# Patient Record
Sex: Female | Born: 1950 | Race: White | Hispanic: No | Marital: Married
Health system: Northeastern US, Academic
[De-identification: ages and names within clinical notes are randomized; demographics above are authoritative.]

## PROBLEM LIST (undated history)

## (undated) DIAGNOSIS — D499 Neoplasm of unspecified behavior of unspecified site: Secondary | ICD-10-CM

## (undated) HISTORY — PX: CHOLECYSTECTOMY: SHX55

## (undated) HISTORY — PX: ABDOMINAL HYSTERECTOMY: SHX81

## (undated) HISTORY — PX: ABDOMINAL SURGERY: SHX537

## (undated) HISTORY — PX: APPENDECTOMY: SHX54

---

## 2019-07-11 MED ORDER — ALBUTEROL SULFATE HFA 90 MCG/ACTUATION AEROSOL INHALER
90 | RESPIRATORY_TRACT | 12 refills | 25.00000 days | Status: AC | PRN
Start: 2019-07-11 — End: ?

## 2019-09-09 MED ORDER — LISINOPRIL 5 MG TABLET
5 | ORAL_TABLET | ORAL | 4 refills | 90.00000 days | Status: AC
Start: 2019-09-09 — End: 2019-10-14

## 2019-10-14 MED ORDER — LISINOPRIL 10 MG TABLET
10 | ORAL_TABLET | Freq: Every day | ORAL | 2 refills | 90.00000 days | Status: AC
Start: 2019-10-14 — End: 2020-04-13

## 2019-10-14 MED ORDER — QVAR 80 MCG/ACTUATION METERED AEROSOL ORAL INHALER
80 | Freq: Two times a day (BID) | RESPIRATORY_TRACT | 3 refills | 30.00000 days | Status: AC
Start: 2019-10-14 — End: 2020-10-21

## 2019-10-14 MED ORDER — VITAMIN D ORAL
ORAL | 0.00 refills | 90.00000 days | Status: AC
Start: 2019-10-14 — End: ?

## 2019-10-14 MED ORDER — TURMERIC ORAL
ORAL | 0.00 refills | 1.00000 days | Status: AC
Start: 2019-10-14 — End: ?

## 2019-10-21 ENCOUNTER — Inpatient Hospital Stay: Admit: 2019-10-21 | Discharge: 2019-10-21 | Payer: PRIVATE HEALTH INSURANCE

## 2019-10-21 ENCOUNTER — Encounter: Admit: 2019-10-21 | Payer: PRIVATE HEALTH INSURANCE | Attending: Medical

## 2019-10-21 ENCOUNTER — Ambulatory Visit: Admit: 2019-10-21 | Payer: PRIVATE HEALTH INSURANCE | Attending: Medical

## 2019-10-21 DIAGNOSIS — K559 Vascular disorder of intestine, unspecified: Secondary | ICD-10-CM

## 2019-10-21 DIAGNOSIS — I10 Essential (primary) hypertension: Secondary | ICD-10-CM

## 2019-10-21 DIAGNOSIS — M25551 Pain in right hip: Secondary | ICD-10-CM

## 2019-10-21 DIAGNOSIS — G8929 Other chronic pain: Secondary | ICD-10-CM

## 2019-10-21 DIAGNOSIS — K8020 Calculus of gallbladder without cholecystitis without obstruction: Secondary | ICD-10-CM

## 2019-10-21 DIAGNOSIS — M1990 Unspecified osteoarthritis, unspecified site: Secondary | ICD-10-CM

## 2019-10-21 DIAGNOSIS — E669 Obesity, unspecified: Secondary | ICD-10-CM

## 2019-10-21 DIAGNOSIS — E785 Hyperlipidemia, unspecified: Secondary | ICD-10-CM

## 2019-10-21 DIAGNOSIS — M5416 Radiculopathy, lumbar region: Secondary | ICD-10-CM

## 2019-10-21 DIAGNOSIS — R739 Hyperglycemia, unspecified: Secondary | ICD-10-CM

## 2019-10-21 NOTE — Progress Notes
Outpatient Orthopedic Clinic History and PhysicalChristine Bryant is a 69 y.o. year old female who presents today with bilateral hip pain. Past Medical History has a past medical history of Arthritis, Cholelithiasis, Chronic right hip pain, Essential hypertension, Hyperglycemia, Hyperlipidemia, Ischemic colitis (HC Code), and Obesity. She also has no past medical history of Hypercholesteremia, Malignant hyperthermia, Obstructive sleep apnea (adult) (pediatric), PONV (postoperative nausea and vomiting), or Sleep apnea.Past Surgical History has a past surgical history that includes Knee cartilage surgery; Appendectomy; Exploratory laparotomy (2014); Cholecystectomy; menisectomy (05/13/2010); laparotomy (Right, 02/2012); Hysterectomy; and Bilateral oophorectomy (Bilateral).AllergiesAllergies Allergen Reactions ? Nickel Rash MedicationsScheduled Meds:No current facility-administered medications for this visit.  Continuous Infusions:PRN Meds:.Social HistorySocial History Socioeconomic History ? Marital status: Married   Spouse name: Not on file ? Number of children: Not on file ? Years of education: Not on file ? Highest education level: Not on file Occupational History ? Not on file Social Needs ? Financial resource strain: Not on file ? Food insecurity   Worry: Not on file   Inability: Not on file ? Transportation needs   Medical: Not on file   Non-medical: Not on file Tobacco Use ? Smoking status: Former Smoker   Packs/day: 1.00   Years: 20.00   Pack years: 20.00   Quit date: 09/18/1998   Years since quitting: 21.1 ? Smokeless tobacco: Never Used Substance and Sexual Activity ? Alcohol use: Yes   Frequency: 2-3 times a week   Drinks per session: 1 or 2   Binge frequency: Never ? Drug use: No ? Sexual activity: Not on file Lifestyle ? Physical activity   Days per week: Not on file   Minutes per session: Not on file ? Stress: Not on file Relationships ? Social Manufacturing systems engineer on phone: Not on file   Gets together: Not on file   Attends religious service: Not on file   Active member of club or organization: Not on file   Attends meetings of clubs or organizations: Not on file   Relationship status: Not on file ? Intimate partner violence   Fear of current or ex partner: Not on file   Emotionally abused: Not on file   Physically abused: Not on file   Forced sexual activity: Not on file Other Topics Concern ? 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  However on examination there is no really hip symptoms per se.Examination revealed both hips are well-seated and stable.  No significant groin or anterior hip pain both hips have good range of motion there is no pain upon flexion and internal rotation.  Patient states that her symptoms appears to be essentially emanating from her back or lower back.  With the radicular pattern towards the front of her legs.Assessment/PlanX-rays of the lumbar spine were obtained I believe there is narrowing of the neural foramina I will L4-5 as well as L5-S1 levels.  Patient's  symptoms may very well be radicular in nature and therefore it patient is referred for physiatry workup regarding possible disc or facet syndrome.Allyson Sabal, MD

## 2019-10-24 ENCOUNTER — Encounter: Admit: 2019-10-24 | Payer: PRIVATE HEALTH INSURANCE | Attending: Medical

## 2019-10-24 MED ORDER — DUEXIS 800 MG-26.6 MG TABLET
ORAL_TABLET | Freq: Three times a day (TID) | ORAL | 3 refills | Status: AC
Start: 2019-10-24 — End: 2020-03-25

## 2019-10-29 ENCOUNTER — Encounter: Admit: 2019-10-29 | Payer: PRIVATE HEALTH INSURANCE | Attending: Medical

## 2019-10-29 MED ORDER — FAMOTIDINE 20 MG TABLET
20 mg | ORAL_TABLET | Freq: Two times a day (BID) | ORAL | 3 refills | Status: AC
Start: 2019-10-29 — End: 2020-10-21

## 2019-10-29 MED ORDER — IBUPROFEN 800 MG TABLET
800 mg | ORAL_TABLET | Freq: Three times a day (TID) | ORAL | 2 refills | Status: AC
Start: 2019-10-29 — End: 2020-07-22

## 2019-11-12 ENCOUNTER — Inpatient Hospital Stay: Admit: 2019-11-12 | Discharge: 2019-11-12 | Payer: PRIVATE HEALTH INSURANCE

## 2019-11-12 ENCOUNTER — Encounter: Admit: 2019-11-12 | Payer: PRIVATE HEALTH INSURANCE

## 2019-11-12 DIAGNOSIS — M1990 Unspecified osteoarthritis, unspecified site: Secondary | ICD-10-CM

## 2019-11-12 DIAGNOSIS — E669 Obesity, unspecified: Secondary | ICD-10-CM

## 2019-11-12 DIAGNOSIS — K559 Vascular disorder of intestine, unspecified: Secondary | ICD-10-CM

## 2019-11-12 DIAGNOSIS — E785 Hyperlipidemia, unspecified: Secondary | ICD-10-CM

## 2019-11-12 DIAGNOSIS — K8020 Calculus of gallbladder without cholecystitis without obstruction: Secondary | ICD-10-CM

## 2019-11-12 DIAGNOSIS — R739 Hyperglycemia, unspecified: Secondary | ICD-10-CM

## 2019-11-12 DIAGNOSIS — I10 Essential (primary) hypertension: Secondary | ICD-10-CM

## 2019-11-12 DIAGNOSIS — M5416 Radiculopathy, lumbar region: Secondary | ICD-10-CM

## 2019-11-12 DIAGNOSIS — M25551 Pain in right hip: Secondary | ICD-10-CM

## 2019-11-12 NOTE — Other
Rehabilitation Services At [facility] [address] [place] [address] Number: [phone] Number: [phone] Physical Therapy Evaluation and Plan of Care6/1/2021Patient Name:  Mikayla WollschleagerMR#:  [id number] Date of Birth:  26-Nov-1952Referring Provider:  Karlyn Agee, MDReferring Provider NPI:  [id number] Therapist:  Elenora Fender, DPTProblem List         ICD-10-CM   PT - LBP  Lumbar radiculopathy M54.16  Your signature indicates that you have reviewed and agree with the established Plan of Care dated 11/12/19.  This document serves to support medical necessity for continued outpatient Physical Therapy services until 01/12/20.PT 2 x weekly x 8 weeks for strengthening, ROM, education, endurance, Home exercise program, manual therapy and modalities as appropriate; in person and/or via telehealth visits. Please co-sign this document electronically or return via the fax number listed on the document.Additional Physician Comments:___________________________________     __________________________________Physician Signature                                           Date___________________________________Physician Printed NamePhysical Therapy Orthopedic Evaluation6/1/2021Patient Name:  Mikayla Bryant Record Number:  [id number] Date of Birth:  1952/01/17Therapist:  Elenora Fender, DPTReferring Provider:  Karlyn Agee, MDICD-10 Diagnosis(es):Problem List         ICD-10-CM   PT - LBP  Lumbar radiculopathy M54.16  General InformationTherapy Episode of Care  Date of Visit:  11/12/2019   Treatment Number:  1   Date the Treatment Plan was Initiated/Reviewed:  11/12/2019  Start of Care Date:  11/12/2019   Onset of Illness/Injury Date:  10/12/2019   Progress Report Due Date:  12/12/2019   MD Order Renewal Date:  01/11/2020 Interpreter Services   Interpreter Services Utilized?  NoCognition / Learning Assessment   Primary Learner Relationship:  Patient        Barriers to learning:  No barriers        Preferred language:  English        Preferred learning style:  ListeningI reviewed the Patient Care Agreement and Attendance Form with the Patient/Family.  The Patient/Family verbalized understanding.Medication Review:Current Outpatient Medications Medication Sig ? albuterol sulfate Inhale 2 puffs into the lungs every 4 (four) hours as needed for wheezing or shortness of breath. ? aspirin Take 81 mg by mouth daily ? atorvastatin Take 1 tablet (20 mg total) by mouth daily. ? Qvar Inhale 1 puff into the lungs 2 (two) times daily. After use, rinse mouth with water. ? ergocalciferol, vitamin D2, (VITAMIN D ORAL) Take 50 International Units by mouth. ? famotidine Take 1 tablet (20 mg total) by mouth 2 (two) times daily. ? ibuprofen Take 1 tablet (800 mg total) by mouth 3 (three) times daily. ? Duexis Take 1 tablet by mouth 3 (three) times daily. ? lisinopriL Take 1 tablet (10 mg total) by mouth daily. ? multivitamin Take 1 capsule by mouth daily. ? sertraline Take 50 mg by mouth daily. ? TURMERIC ORAL Take by mouth. ? zinc gluconate Take 50 mg by mouth daily. SubjectivePt reports pain to low back. She reports that she takes Ibuprofen 2x/day to alleviate pain. She reports recently she was doing a lot of work in her garden with bending which aggravated her back. Aggravating factors include sitting and bending. Alleviating factors include ibuprofen and CBD cream. She reports mild improvement in her back pain since it started.Pertinent History of Current Problem: Pt reports she has had previous episodes of back pain. Pt reports she  was doing PT in the pool. Reports she had a cortisone shot about a year and a half ago to her back, and had reliefOther Providers: Karlyn Agee, MD Past Medical HistoryPast Medical History: Diagnosis Date ? Arthritis   right hip ? Cholelithiasis  ? Chronic right hip pain  ? Essential hypertension  ? Hyperglycemia  ? Hyperlipidemia  ? Ischemic colitis (HC Code)  ? Obesity  Past Surgical HistoryPast Surgical History: Procedure Laterality Date ? APPENDECTOMY    appendicitis 8th grade, emergency surgery ? BILATERAL OOPHORECTOMY Bilateral   Left with hysterectomy  r withexcision endometrial mass 2013 ? CHOLECYSTECTOMY   ? EXPLORATORY LAPAROTOMY  2014 ? HYSTERECTOMY    25 years ago for prolapsed uterus ? KNEE CARTILAGE SURGERY    3-4 times ? LAPAROTOMY Right 02/2012  excision benign pelvic mass with incidental repair of bladder injury ? menisectomy  05/13/2010 AllergiesNickelPain Rating:  2/ 10 feels like there is a big knot thereAt its worst: 8/10 with bending, sittingAt its best: 0/10 with ibuprofenSocial / Emotional Information: retired, likes to garden Prior Level of Function: ind   Change in Status from prior level of function? modifiesObjectivePosture: Mild forward flexed postureMMT/ROM:Lumbar ROM/MMTLumbar ROM MMT    Flexion FULL - pain 4-/5    Extension Full feels better 4/5    Side Flexion (R) 85% 4/5    Side Flexion (L) 65% P 3+/5    Rotation (R)  85% P 4-/5    Rotation (L) 85% 4/5 LE ROM and strength grossly WNL and 4+/5Bilateral hip adduction 4/5, hip abduction 4-/5Joint Mobility:Hypomobility: mild hypomobilityPalpation:No TTP elicitedSensation:Light Touch: WNLSpecial TestsSlump (-) bilaterallySLR (-) bilaterallyFunctional MobilityBed Mobility: ind, modifies for painTransfers:  Ind, modifies for painAmbulation: ind     Assistive Device:  NoneGait AssessmentWNL - mild decrease in trunk rotationOrthopedic Tools/Scales/Outcome MeasuresOswestry Low Back Pain Scale Pain intensity:  4      Personal care:  1      Lifting:  1      Walking:  1      Sitting:  5      Standing:  1      Sleeping:  0      Social life:  2      Traveling:  1      Changing degree of pain:  1   Score:  34 % impairmentsTreatment Provided This VisitCPT Code Interventions Timed Minutes Untimed Minutes Total Minutes Physical Therapy Evaluation - Moderate Complexity (97162) IE for Lumbar Radiculopathy-Prone on elbows x 2 min-hooklying hip ab (green theraband)/ ad 2 x 10-Wall slides with physioball fwd and on diagonals 2 x 10Updated HEP, pt provided with written instruction 60   N/A     N/A     N/A       Total Treatment Time: 60 Problem ListLBPMild decreased trunk ROMDecreased trunk strengthDecreased tolerance for sittingDecreased tolerance for walkingAssessmentPt is a 69 y.o. presenting to PT for initial evaluation of LBP. Pt presents with LBP, mild decreased trunk ROM, decreased trunk strength, decreased tolerance for sitting, decreased tolerance for walking. Patient will benefit from skilled Physical therapy intervention to address the above mentioned impairments and progress to functional independence.Patient / Family / Caregiver EducationDiscussed role of therapyDiscussed the value of collaboration with other providersDiscussed the presenting problemReviewed the assessmentDiscussed plan of care and rationalePatient/Family/Caregiver demonstrate agreement with the planWritten materials / instruction providedRehab PotentialGoodPlanPhysical Therapy Evaluation - Moderate Complexity (97162)Therapeutic Exercise (97110)Manual Therapy (97140)Therapeutic Activity (97530)Self-Care/Home Management (97535)Gait Training (97116)Neuromuscular Re-Education (97112)Heat - Ice Pack Frequency:  2x per  weekDuration:  8 weeksPlan for Next Visit Review HEP, extension based exercise, postural educationRecommendationsUse of lumbar roll GoalsShort Term Goals (4  Weeks)1) Pt will be able to tolerate trunk, back and LE exercise HEP 2x/day without increased pain2) increase trunk ROM by 10'3) increase lumbar strength 1/2 grade4) Pt will tolerate at least 15 min of walking without increase in symptomsLong Term Goals (8 weeks)1) Pt will be able to reach to floor with good form with < or = to 5/10 pain report2) Demonstrates good posture and body mechanics for bending, lifting and reaching to reduce the risk of reoccurrence of symptoms 3) Pt will tolerate walking 30 min without increased symptoms, to allow patient to attend medical appointments, social gatherings without increased symptoms.4) Pt will be able to tolerate sitting for at least 30 min without increase in her symptoms5) Pt will be independent with appropriate HEP

## 2019-11-19 ENCOUNTER — Inpatient Hospital Stay: Admit: 2019-11-19 | Discharge: 2019-11-19 | Payer: PRIVATE HEALTH INSURANCE

## 2019-11-19 DIAGNOSIS — M5416 Radiculopathy, lumbar region: Secondary | ICD-10-CM

## 2019-11-19 NOTE — Other
REHABILITATION SERVICES AT OLD [facility] Services At [facility] [address] [place] [id number] Number: [phone] Number: [phone] Therapy Daily Note6/8/2021Patient Name:  Mikayla Bryant Record Number:  [id number] Date of Birth:  April 16, 1952Therapist:  Elenora Fender, DPTReferring Provider:  Karlyn Bryant, MDICD-10 Diagnosis(es):Problem List         ICD-10-CM   PT - LBP  Lumbar radiculopathy M54.16  General InformationTherapy Episode of Care  Date of Visit:  11/19/2019   Treatment Number:  2   Date the Treatment Plan was Initiated/Reviewed:  11/12/2019  Start of Care Date:  11/12/2019   Onset of Illness/Injury Date:  10/12/2019   Progress Report Due Date:  12/12/2019   MD Order Renewal Date:  01/11/2020 Precautions/Limitations   Precautions/Limitations:  Standard precautionsInterpreter Services   Interpreter Services Utilized?  NoCognition / Learning Assessment   Primary Learner Relationship:  Patient        Barriers to learning:  No barriers        Preferred language:  English        Preferred learning style:  ListeningI reviewed the Patient Care Agreement and Attendance Form with the Patient/Family.  The Patient/Family verbalized understanding.Medication Review:Current Outpatient Medications Medication Sig ? albuterol sulfate Inhale 2 puffs into the lungs every 4 (four) hours as needed for wheezing or shortness of breath. ? aspirin Take 81 mg by mouth daily ? atorvastatin Take 1 tablet (20 mg total) by mouth daily. ? Qvar Inhale 1 puff into the lungs 2 (two) times daily. After use, rinse mouth with water. ? ergocalciferol, vitamin D2, (VITAMIN D ORAL) Take 50 International Units by mouth. ? famotidine Take 1 tablet (20 mg total) by mouth 2 (two) times daily. ? ibuprofen Take 1 tablet (800 mg total) by mouth 3 (three) times daily. ? Duexis Take 1 tablet by mouth 3 (three) times daily. ? lisinopriL Take 1 tablet (10 mg total) by mouth daily. ? multivitamin Take 1 capsule by mouth daily. ? sertraline Take 50 mg by mouth daily. ? TURMERIC ORAL Take by mouth. ? zinc gluconate Take 50 mg by mouth daily. SubjectivePt reports compliance with her HEP. Pt reports stiffness in her back in the morning and at the end of the day. ObjectiveTreatment Provided This VisitCPT Code Interventions Timed Minutes Untimed Minutes Total Minutes Therapeutic Exercise (97110) -hooklying hip ab (progressed to blue theraband)/ ad 2 x 10-Wall slides with physioball fwd 2 x 10 Added:-Mini-bridges 2 x 10-supine marches 2 x 10-HS stretches 2 x 10Updated HEP 30   N/A     N/A     N/A       Total Treatment Time: 30 AssessmentChristine demonstrates mild improvement in her back pain. She tolerated session well without increase in her pain. Plan to continue with stabilization and extension based exercises, LE stretches.PlanFrequency:  2x per week x 8 weeksPlan for Next VisitReview HEP, extension based exercise, postural education

## 2019-11-26 ENCOUNTER — Inpatient Hospital Stay: Admit: 2019-11-26 | Discharge: 2019-11-26 | Payer: PRIVATE HEALTH INSURANCE

## 2019-11-26 DIAGNOSIS — M5416 Radiculopathy, lumbar region: Secondary | ICD-10-CM

## 2019-11-26 NOTE — Other
REHABILITATION SERVICES AT OLD [facility] Services At [facility] [address]  [id number] Number: [phone] Number: [phone] Therapy Daily Note6/15/2021Patient Name:  Mikayla Bryant Record Number:  [id number] Date of Birth:  07-22-1952Therapist:  Elenora Fender, DPTReferring Provider:  Karlyn Agee, MDICD-10 Diagnosis(es):Problem List         ICD-10-CM   PT - LBP  Lumbar radiculopathy M54.16  General InformationTherapy Episode of Care  Date of Visit:  11/26/2019   Treatment Number:  3/7   Date the Treatment Plan was Initiated/Reviewed:  11/12/2019  Start of Care Date:  11/12/2019   Onset of Illness/Injury Date:  10/12/2019   Progress Report Due Date:  12/12/2019   MD Order Renewal Date:  01/11/2020 Precautions/Limitations   Precautions/Limitations:  Standard precautionsInterpreter Services   Interpreter Services Utilized?  NoCognition / Learning Assessment   Primary Learner Relationship:  Patient        Barriers to learning:  No barriers        Preferred language:  English        Preferred learning style:  ListeningI reviewed the Patient Care Agreement and Attendance Form with the Patient/Family.  The Patient/Family verbalized understanding.Medication Review:Current Outpatient Medications Medication Sig ? albuterol sulfate Inhale 2 puffs into the lungs every 4 (four) hours as needed for wheezing or shortness of breath. ? aspirin Take 81 mg by mouth daily ? atorvastatin Take 1 tablet (20 mg total) by mouth daily. ? Qvar Inhale 1 puff into the lungs 2 (two) times daily. After use, rinse mouth with water. ? ergocalciferol, vitamin D2, (VITAMIN D ORAL) Take 50 International Units by mouth. ? famotidine Take 1 tablet (20 mg total) by mouth 2 (two) times daily. ? ibuprofen Take 1 tablet (800 mg total) by mouth 3 (three) times daily. ? Duexis Take 1 tablet by mouth 3 (three) times daily. ? lisinopriL Take 1 tablet (10 mg total) by mouth daily. ? multivitamin Take 1 capsule by mouth daily. ? sertraline Take 50 mg by mouth daily. ? TURMERIC ORAL Take by mouth. ? zinc gluconate Take 50 mg by mouth daily. SubjectivePt reports compliance with her HEP. Pt reports improvement in her LBP.ObjectiveTreatment Provided This VisitCPT Code Interventions Timed Minutes Untimed Minutes Total Minutes Therapeutic Exercise (97110) -Wall slides with physioball fwd 2 x 10 -Mini-bridges 2 x 10-supine marches 2 x 10-HS stretches 2 x 10Added:-physioball wall push ups 2 x 10-opposite arm/leg lifts 2 x 10-cat-cow 2 x 10Updated HEP 30   N/A     N/A     N/A       Total Treatment Time: 30 AssessmentChristine demonstrates mild improvement in her back pain. She tolerated session well without increase in her pain. Pt with R> L HS tightness. Verbal cuing with bridges to stay within pain free ROM. Plan to continue with stabilization and extension based exercises, LE stretches.PlanFrequency:  2x per week x 8 weeksPlan for Next VisitReview HEP, extension based exercise, postural education

## 2019-12-03 ENCOUNTER — Inpatient Hospital Stay: Admit: 2019-12-03 | Discharge: 2019-12-03 | Payer: PRIVATE HEALTH INSURANCE

## 2019-12-03 DIAGNOSIS — M5416 Radiculopathy, lumbar region: Secondary | ICD-10-CM

## 2019-12-03 NOTE — Other
REHABILITATION SERVICES AT OLD [facility] Services At [facility] [address] [place] [id number] Number: [phone] Number: [phone] Therapy Daily Note6/22/2021Patient Name:  Mikayla Bryant Record Number:  [id number] Date of Birth:  17-Feb-1952Therapist:  Elenora Fender, DPTReferring Provider:  Karlyn Agee, MDICD-10 Diagnosis(es):Problem List         ICD-10-CM   PT - LBP  Lumbar radiculopathy M54.16  General InformationTherapy Episode of Care  Date of Visit:  12/03/2019   Treatment Number:  4/7   Date the Treatment Plan was Initiated/Reviewed:  11/12/2019  Start of Care Date:  11/12/2019   Onset of Illness/Injury Date:  10/12/2019   Progress Report Due Date:  12/12/2019   MD Order Renewal Date:  01/11/2020 Precautions/Limitations   Precautions/Limitations:  Standard precautionsInterpreter Services   Interpreter Services Utilized?  NoCognition / Learning Assessment   Primary Learner Relationship:  Patient        Barriers to learning:  No barriers        Preferred language:  English        Preferred learning style:  ListeningI reviewed the Patient Care Agreement and Attendance Form with the Patient/Family.  The Patient/Family verbalized understanding.Medication Review:Current Outpatient Medications Medication Sig ? albuterol sulfate Inhale 2 puffs into the lungs every 4 (four) hours as needed for wheezing or shortness of breath. ? aspirin Take 81 mg by mouth daily ? atorvastatin Take 1 tablet (20 mg total) by mouth daily. ? Qvar Inhale 1 puff into the lungs 2 (two) times daily. After use, rinse mouth with water. ? ergocalciferol, vitamin D2, (VITAMIN D ORAL) Take 50 International Units by mouth. ? famotidine Take 1 tablet (20 mg total) by mouth 2 (two) times daily. ? ibuprofen Take 1 tablet (800 mg total) by mouth 3 (three) times daily. ? Duexis Take 1 tablet by mouth 3 (three) times daily. ? lisinopriL Take 1 tablet (10 mg total) by mouth daily. ? multivitamin Take 1 capsule by mouth daily. ? sertraline Take 50 mg by mouth daily. ? TURMERIC ORAL Take by mouth. ? zinc gluconate Take 50 mg by mouth daily. SubjectivePt reports compliance with her HEP. Pt reports she is having a good day with her back. Reports she has been going on 2.5 mi walks uphill/downhill, took two days to recover.ObjectiveTreatment Provided This VisitCPT Code Interventions Timed Minutes Untimed Minutes Total Minutes Therapeutic Exercise (97110) -Wall slides with physioball fwd and diagonals 2 x 10 -supine marches 2 x 10-HS stretches 2 x 10-physioball wall push ups 2 x 10-opposite arm/leg lifts 2 x 10Added:-green theraband horizontal abd and diagonals 2 x 10 eachUpdated HEP 30   N/A     N/A     N/A       Total Treatment Time: 30 AssessmentChristine demonstrates mild improvement in her back pain. Good response with stabilization exercises. Discussed avoiding movements that aggravate her pain and walking on level ground for her daily walks. Plan to continue with stabilization and extension based exercises, LE stretches.PlanFrequency:  2x per week x 8 weeksPlan for Next VisitReview HEP, stabilization exercises, postural education

## 2020-02-27 MED ORDER — ATORVASTATIN 20 MG TABLET
20 | ORAL_TABLET | ORAL | 3 refills | 90.00000 days | Status: AC
Start: 2020-02-27 — End: 2020-12-10

## 2020-03-03 ENCOUNTER — Telehealth: Admit: 2020-03-03 | Payer: PRIVATE HEALTH INSURANCE | Attending: Medical

## 2020-03-03 NOTE — Telephone Encounter
Received call from Patient. She states she had a Visit 5/10, with Dr Dorna Bloom, for her Right Hip, but that he thought her pain was coming from her Lower Back. She states she was referred for PT, which she completed. She is still having Lower Back pain, intermittently, described as 8/10. She is asking for a call back, to discuss how to move forward. Please advise.Jonelle Sidle [facility] Nurse Advisor

## 2020-03-04 ENCOUNTER — Telehealth: Admit: 2020-03-04 | Payer: PRIVATE HEALTH INSURANCE

## 2020-03-04 ENCOUNTER — Telehealth: Admit: 2020-03-04 | Payer: PRIVATE HEALTH INSURANCE | Attending: Medical

## 2020-03-04 NOTE — Telephone Encounter
Called patient and spoke to her about her symptoms and the last note Dr. Dorna Bloom put in her chart. He detailed in his note that she should set up a consult with Physiatry. I will call her back with an appointment @ I speak to Danyale (Physiatry Admin).

## 2020-03-04 NOTE — Telephone Encounter
Message given to patient.Should follow w/ provider Dorna Bloom) for next steps. Maybe imaging? Maybe referral to spine? Up to him. She can call [facility]'s admin who communicates w/ him. He does not check basket notes but he talks to/texts w/ admin regularly.

## 2020-03-23 ENCOUNTER — Ambulatory Visit: Admit: 2020-03-23 | Payer: PRIVATE HEALTH INSURANCE

## 2020-03-24 ENCOUNTER — Inpatient Hospital Stay: Admit: 2020-03-24 | Discharge: 2020-03-24 | Payer: PRIVATE HEALTH INSURANCE

## 2020-03-24 DIAGNOSIS — Z20822 Contact with and (suspected) exposure to covid-19: Secondary | ICD-10-CM

## 2020-03-24 DIAGNOSIS — Z01812 Encounter for preprocedural laboratory examination: Secondary | ICD-10-CM

## 2020-03-24 LAB — COVID-19 CLEARANCE OR FOR PLACEMENT ONLY: BKR SARS-COV-2 RNA (COVID-19) (YH): NEGATIVE

## 2020-03-26 MED ORDER — ALBUTEROL SULFATE HFA 90 MCG/ACTUATION AEROSOL INHALER
90 | RESPIRATORY_TRACT | 12 refills | 25.00000 days | Status: AC | PRN
Start: 2020-03-26 — End: ?

## 2020-03-26 MED ORDER — BUDESONIDE-FORMOTEROL HFA 80 MCG-4.5 MCG/ACTUATION AEROSOL INHALER
80-4.5 | Freq: Two times a day (BID) | RESPIRATORY_TRACT | 12 refills | 30.00000 days | Status: AC
Start: 2020-03-26 — End: 2020-07-07

## 2020-04-02 ENCOUNTER — Encounter: Admit: 2020-04-02 | Payer: PRIVATE HEALTH INSURANCE | Attending: Orthopedic

## 2020-04-06 ENCOUNTER — Encounter: Admit: 2020-04-06 | Payer: PRIVATE HEALTH INSURANCE | Attending: Physical Medicine and Rehabilitation

## 2020-04-13 MED ORDER — LISINOPRIL 10 MG TABLET
10 | ORAL_TABLET | ORAL | 2 refills | 90.00000 days | Status: AC
Start: 2020-04-13 — End: 2020-10-14

## 2020-06-27 ENCOUNTER — Encounter: Admit: 2020-06-27 | Payer: PRIVATE HEALTH INSURANCE | Attending: Medical

## 2020-07-07 MED ORDER — BUDESONIDE-FORMOTEROL HFA 80 MCG-4.5 MCG/ACTUATION AEROSOL INHALER
80-4.5 | Freq: Two times a day (BID) | RESPIRATORY_TRACT | 12 refills | 30.00000 days | Status: AC
Start: 2020-07-07 — End: ?

## 2020-07-20 ENCOUNTER — Telehealth: Admit: 2020-07-20 | Payer: PRIVATE HEALTH INSURANCE | Attending: Medical

## 2020-07-20 ENCOUNTER — Encounter: Admit: 2020-07-20 | Payer: PRIVATE HEALTH INSURANCE | Attending: Medical

## 2020-07-20 DIAGNOSIS — M25551 Pain in right hip: Secondary | ICD-10-CM

## 2020-07-20 MED ORDER — MELOXICAM 15 MG TABLET
15 mg | ORAL_TABLET | Freq: Every day | ORAL | 3 refills | Status: AC
Start: 2020-07-20 — End: 2020-10-21

## 2020-07-20 MED ORDER — FAMOTIDINE 20 MG TABLET
20 mg | ORAL_TABLET | Freq: Two times a day (BID) | ORAL | 1 refills | Status: AC
Start: 2020-07-20 — End: 2020-10-21

## 2020-07-20 NOTE — Telephone Encounter
Received TC from patientShe is asking for a refill of Motrin 800 mg and Pepcid 20 mg [id number] [place], [place] - [address] Phone:  [phone] Fax:  [phone]  Pt takes the motrin for chronic right hip pain

## 2020-07-21 MED ORDER — IBUPROFEN 800 MG TABLET
800 | ORAL_TABLET | Freq: Three times a day (TID) | ORAL | 2 refills | 15.00000 days | Status: AC
Start: 2020-07-21 — End: 2020-10-21

## 2020-10-14 MED ORDER — LISINOPRIL 10 MG TABLET
10 | ORAL_TABLET | ORAL | 2 refills | 90.00000 days | Status: AC
Start: 2020-10-14 — End: 2021-01-25

## 2020-10-21 MED ORDER — MELOXICAM 7.5 MG TABLET
7.5 | ORAL_TABLET | Freq: Every day | ORAL | 4 refills | 30.00000 days | Status: AC | PRN
Start: 2020-10-21 — End: 2021-02-16

## 2020-10-21 MED ORDER — METFORMIN ER 500 MG TABLET,EXTENDED RELEASE 24 HR
500 | ORAL_TABLET | ORAL | 4 refills | 90.00000 days | Status: AC
Start: 2020-10-21 — End: 2021-01-18

## 2020-10-21 MED ORDER — FAMOTIDINE 20 MG TABLET
20 | ORAL_TABLET | Freq: Every day | ORAL | 3 refills | 45.00000 days | Status: AC
Start: 2020-10-21 — End: ?

## 2020-10-22 ENCOUNTER — Telehealth: Admit: 2020-10-22 | Payer: PRIVATE HEALTH INSURANCE | Attending: Physical Medicine and Rehabilitation

## 2020-10-22 NOTE — Telephone Encounter
Call placed to patient to ask about any previous injections or surgeries on her back. Patient stated she has had no surgeries but had an injection 3 years ago at [facility] (unable to remember provider name). When looking under carewhere patient has had a bone density exam of the spine and hip 3years ago at [facility], report available in Epic. Confirmed appt.

## 2020-10-28 MED ORDER — BUDESONIDE-FORMOTEROL HFA 160 MCG-4.5 MCG/ACTUATION AEROSOL INHALER
160-4.5 | Freq: Two times a day (BID) | RESPIRATORY_TRACT | 2 refills | 30.00000 days | Status: AC
Start: 2020-10-28 — End: ?

## 2020-10-28 MED ORDER — METHYLPREDNISOLONE 4 MG TABLETS IN A DOSE PACK
4 | ORAL_TABLET | ORAL | 1 refills | 6.00000 days | Status: AC
Start: 2020-10-28 — End: 2020-11-06

## 2020-10-29 ENCOUNTER — Inpatient Hospital Stay: Admit: 2020-10-29 | Discharge: 2020-10-29 | Payer: PRIVATE HEALTH INSURANCE

## 2020-10-29 DIAGNOSIS — R059 Cough, unspecified: Secondary | ICD-10-CM

## 2020-11-05 ENCOUNTER — Ambulatory Visit: Admit: 2020-11-05 | Payer: PRIVATE HEALTH INSURANCE | Attending: Physical Medicine and Rehabilitation

## 2020-11-05 ENCOUNTER — Encounter: Admit: 2020-11-05 | Payer: PRIVATE HEALTH INSURANCE | Attending: Physical Medicine and Rehabilitation

## 2020-11-05 DIAGNOSIS — I10 Essential (primary) hypertension: Secondary | ICD-10-CM

## 2020-11-05 DIAGNOSIS — R739 Hyperglycemia, unspecified: Secondary | ICD-10-CM

## 2020-11-05 DIAGNOSIS — M5441 Lumbago with sciatica, right side: Secondary | ICD-10-CM

## 2020-11-05 DIAGNOSIS — K8020 Calculus of gallbladder without cholecystitis without obstruction: Secondary | ICD-10-CM

## 2020-11-05 DIAGNOSIS — M25551 Pain in right hip: Secondary | ICD-10-CM

## 2020-11-05 DIAGNOSIS — K559 Vascular disorder of intestine, unspecified: Secondary | ICD-10-CM

## 2020-11-05 DIAGNOSIS — E785 Hyperlipidemia, unspecified: Secondary | ICD-10-CM

## 2020-11-05 DIAGNOSIS — M1990 Unspecified osteoarthritis, unspecified site: Secondary | ICD-10-CM

## 2020-11-05 DIAGNOSIS — E669 Obesity, unspecified: Secondary | ICD-10-CM

## 2020-11-05 NOTE — Patient Instructions
It was a pleasure to see you today and I thank you for involving me in your care.Curcumin has to be combined with piperidine The ?Big 3? stabilizations exercise to improve spine stability.Work up to [employer] of 10Curl Up: Avoid movement through lumbar spine and lift only the head and shoulders. You can place hands in small of back to monitor movement and ensure spine remains in neutral.Side-Bridge [facility]):  Begin on one side supported by elbow and hip. Then engage your gluteal and abdominal muscles to raise your hips off the ground supporting your body on your elbow and knee. For a more challenging version, progress to the feet.  Perform on both sides.Birddog: Start on hands and knees, then one leg is lifted into extension, followed by lifting opposite extended arm. Perform on alternating sides. **these can be modified to stairs or a counter top height if kneeling or getting on and off the ground is hard, please ask.*McGill, S.M. (2009) Ultimate back fitness and performance - Fourth Edition, [employer]. [place], [place] (www.backfitpro.com).Utilize the website below for videos of these exercises.AnMRI.uy strengthening exercise:-Side-Lying Hip Abduction:Lie on your side with your hips stacked on top of each other. Position your top leg behind your front leg by about 5 degrees, so your top heel is behind you. Keeping your core engaged and your knee straight, lift your top ankle up toward the ceiling without moving your pelvis at all. Brace yourself with your hands or elbows as needed. Hold the top portion for 1-3 seconds and feel your glutes engage. Return your top foot to the starting position and continue, maintaining a slow and steady pace, and perfect, controlled form.McBeth J.M. Hip Muscle Activity During 3 Side-Lying Hip-Strengthening Exercises in Distance Runners. Journal of Physiological scientist. 2012 Jan-Feb; 47(1): 15-23. Utilize the website below for videos of these [employer]Mechanics.si how his back is away from the wall but his heal is touching the wall. The wall forces you to keep your leg behind your spine to isolate the correct hip muscles.Single leg Stance hip strengthening progression: Work up to 3 sets and progress forward after the first phase is no longer challenging (done with something near by for safety to prevent falls):-Single leg stance for 1 minute (other knee bent backwards, no shaking) THEN:-Single leg stance for 1 minute holding a gallon of water: -Single leg stance with leg lifts for 3 x10 (knee straight, lift leg away and behind)-Single leg squats x10 reps (dont let the knee dive inward).-Single leg squat on an unstable platform x10 reps (dont let the knee dive inward).Stay active in your home exercise program.  Exercise is your medicine.At least 30 minutes,5 times per week (138minutes/week) of moderate intensity aerobic exercise (breaking a sweat while able to talk, but not sing) can help to:	-Control Your Weight-Reduce Your Risk of Cardiovascular Disease-Reduce your risk of Type 2 Diabetes and Metabolic Syndrome-Reduce Your Risk of Some Cancers-Strengthen Your Bones and Muscles-Improve Your Mental Health, Sleep and Mood-Improve Your Ability to do Daily Activities and Prevent Falls-Increase Your Chances of Living LongerI recommend a 10%/per week increase toward 10k steps per day or 150 minutes a week of aerobic exercise. Heat to warm up before exercise and ice for post exercise pain/swelling.Tylenol up to 3000mg  per day can be used as needed for pain control and is safe to be taken with a nonsteroidal (NSAIDs) type of medication. TENS units (available on Amazon) use electricity to make your brain feel less pain and are very safe to try unless you have a pacemaker or  defibrillator.Optimize mental health in order to promote your ideal rehabilitation trajectory.  I have a pain psychologist on my team which can be helpful.Proper sleep hygiene is very important (no caffeine after lunch, limit screen time for an hour before bed, establish a bed time routine, etc).Diet is very important! Making more anti-inflammatory food choices can help improve pain symptoms. A ketogenic diet also has many reported health benefits. Ninfa Linden has nutritionists I can refer you too.Discussed with your primary care doctor about low vitamin-D levels as treating a deficiency can help improve pain.Smoking cessation can help improve pain, in addition to saving you time, money and health.Explore mindfulness meditation for additional non-pharmacologic self-management strategies. Headspace is a smartphone application and is worth a trial.  This type of meditation out performs all medications in the long-term.Dr. Calla Kicks has written many helpful books for my patients, I recommend 'Healing back pain' and 'The mind body prescription' as reading these books have helped many of my patients. Educational YouTube Videos about pain:This is a 5-minute video about chronic pain. It describes what current research has been saying about chronic pain - it?s not just a disc, joint or muscle problem, but rather a 're-wiring' of the brain which has essentially become more sensitive than before.Https://youtu.be/C_[email or web address] is a 10-minute, easy to follow video about back pain. This is a great overall summary about what we know about back pain.Https://youtu.be/BOjTegn9RuYThis is a long (60+min) podcast but the guest, Dr. Jeri Cos summarizes the complexity and management of chronic pain: Lawsponsor.fr. Frederick Peers is a Sales promotion account executive in pain management. At times the information is quite technical, but his descriptions are also engaging and humorous. Video of one of his lectures: WeeklyCards.ca Dr. Kemper Durie is another leader in the field of pain medicine. Here is a video of his lecture describing how the brain affects pain and pain affects the brain: http://vimeo.ZOX/0960454 Return to the office for follow-up No follow-ups on file., do not hesitate to contact our office sooner if ?issues or questions arise.Your time is important. Follow-up visits are only 20 minutes.  Please arrive before your scheduled appointment start time to allow for check and the rooming process to give [place] the full 20 minutes.Procedure visits are to answer any questions regarding the procedure and for the injection. Your overall progress and any new issues can be discussed at your next office visit.Late arrivals will result in less time with the physician or being asked to wait. If those are options are not acceptable, you will have the option to reschedule. If you would like to discuss a new or different issue at your next office visit, please call us to let us know to schedule you more time for evaluation in a 'new problem' 40 minute visit.  Thank you!Ihor Dow, MD

## 2020-11-06 NOTE — Progress Notes
[facility] [employer] scheduling: [phone], Fax: [phone], [email or web address] office: [address] Building, [address] [place] [address], [place], [place] [address]NGE Patient VisitName: Mikayla Bryant: 1952/03/02MRN: [id number] Date: 05/26/22Referred by: Coolidge Breeze, MD Primary care physician: Richarda Osmond RockfeldCC:  Low back pain No chief complaint on file.History of Present Illness: Mikayla Bryant is a 70 y.o. female with a medical history that is described below who presents with the above chief complaint.  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 Coolidge Breeze, MDChristine has a past medical health history obesity, hyperlipidemia, hyperglycemia, lumbar radiculopathy, IgM deficiency, endometriosis and surgical history of knee cartilage surgery describes pain localized to the lower back with radiation described to the bilateral lower extremities that is further detailed below. She originally saw her PCP who prescribed her mobic and Medrol pak with significant effective pain relief. She has had knee pain for the past 30 years which progressively got worse, and is now effecting the lower back. Prior evaluations for this CC include Dr. Louanne Belton, Dr. Dorna Bloom and prior diagnoses include low back pain.Overall symptoms have moderately worsened over time. There is currently no associated weakness nor paresthesias. Symptoms are worsened by prolonged walking, bending, rotational activities.Symptoms are improved by rest, medications, stretching.  Pain impacts sleep. There is bilateral groin pain and no abdominal pain. Symptoms not exacerbated with coughing or sneezing.Laurice denies any associated changes in balanace, falls, insensate loss of bowel or bladder.Functional limitations from symptoms include activities of daily living. [name] was last happy with their functional status prior to recent flare up.Patient goals include diagnosis and treatment.Treatments to Date:Alisa has tried 4 PT sessions at [facility] most recently as 12/03/2019 and found it minimally helpful. Her daughter is a PTA.Performs HEP described as biking, walking 2-3 miles daily. She notes that lately this has been limited due to her symptoms.Medications: taking turmeric, mobic and medrol and finds it significantly helpful. She has tried ibuprofen 800mg  BID with some relief.she has not tried acupuncture, chiropractic care or massage.Procedure log relevant to chief complaint:History of injections approximately 3 years ago, unclear where and what Mea also sees:Follow-up visit with Dr. 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 Past Surgical History: She has a past surgical history that includes Knee cartilage surgery; Appendectomy; Exploratory laparotomy (2014); Cholecystectomy; menisectomy (05/13/2010); laparotomy (Right, 02/2012); Hysterectomy; and Bilateral oophorectomy (Bilateral). Meds: Current Outpatient Medications: ?  albuterol sulfate (PROAIR HFA) 90 mcg/actuation HFA aerosol inhaler, Inhale 2 puffs into the lungs every 4 (four) hours as needed for wheezing or shortness of breath., Disp: 1 Inhaler, Rfl: 11?  albuterol sulfate 90 mcg/actuation HFA aerosol inhaler, Inhale 2 puffs into the lungs every 4 (four) hours as needed for wheezing or shortness of breath., Disp: 6.7 g, Rfl: 11?  aspirin 81 MG EC tablet, Take 81 mg by mouth daily, Disp: , Rfl: ?  atorvastatin (LIPITOR) 20 mg tablet, TAKE 1 TABLET BY MOUTH EVERY DAY, Disp: 90 tablet, Rfl: 2?  budesonide-formoteroL (SYMBICORT) 160-4.5 mcg/actuation HFA aerosol inhaler, Inhale 2 puffs into the lungs in the morning and 2 puffs before bedtime., Disp: 10.2  g, Rfl: 1?  budesonide-formoteroL (SYMBICORT) 80-4.5 mcg/actuation HFA aerosol inhaler, Inhale 2 puffs into the lungs 2 (two) times daily., Disp: 10.2 g, Rfl: 11?  ergocalciferol, vitamin D2, (VITAMIN D ORAL), Take 50 International Units by mouth., Disp: , Rfl: ?  famotidine (PEPCID) 20 mg tablet, Take 1 tablet (20 mg total) by mouth daily., Disp: 30 tablet, Rfl: 2?  lisinopriL (PRINIVIL,ZESTRIL) 10 mg tablet, TAKE 1 TABLET BY MOUTH EVERY DAY, Disp: 90 tablet, Rfl: 1?  meloxicam (MOBIC) 7.5 mg tablet, Take 1 tablet (7.5 mg total) by mouth daily as needed., Disp: 30 tablet, Rfl: 3?  metFORMIN XR (GLUCOPHAGE-XR) 500 mg 24 hr tablet, Take 1 tablet (500 mg total) by mouth daily with dinner., Disp: 30 tablet, Rfl: 3?  methylPREDNISolone (MEDROL DOSEPACK) 4 mg tablet, follow package directions, Disp: 21 tablet, Rfl: 0?  multivitamin capsule, Take 1 capsule by mouth daily., Disp: , Rfl: ?  TURMERIC ORAL, Take by mouth., Disp: , Rfl: ?  zinc gluconate 50 mg tablet, Take 50 mg by mouth daily., Disp: , Rfl: Allergies: She is allergic to nickel. Family History: Proofreader family history includes Brain cancer in her mother and sister; Breast cancer in her mother; COPD in her father; Cancer in her father; Colon cancer in her mother; Dementia in her maternal grandfather and mother; Hypertension in her father and sister; No Known Problems in her sister; Osteoporosis in her sister; Seizures in her father; Throat cancer in her father.Social History:  reports that she quit smoking about 22 years ago. She has a 20.00 pack-year smoking history. She has never used smokeless tobacco. She reports current alcohol use. She reports that she does not use drugs. Work, hobbies, exercise and relevant family details also include right handed, not working, married with kids, active walking, sleep quality and diet good.  Review of Systems Review of Systems Constitutional: Negative for chills, diaphoresis, fever, malaise/fatigue and weight loss. HENT: Positive for hearing loss and sinus pain. Negative for congestion, ear discharge, ear pain, nosebleeds, sore throat and tinnitus.  Eyes: Negative for blurred vision, double vision, photophobia, pain, discharge and redness. Respiratory: Positive for shortness of breath and wheezing. Negative for cough, hemoptysis, sputum production and stridor.  Cardiovascular: Negative for chest pain, palpitations, orthopnea, claudication, leg swelling and PND. Gastrointestinal: Negative for abdominal pain, blood in stool, constipation, diarrhea, heartburn, melena, nausea and vomiting. Genitourinary: Negative for dysuria, flank pain, frequency, hematuria and urgency. Musculoskeletal: Positive for back pain and neck pain. Negative for falls, joint pain and myalgias. Skin: Negative for itching and rash. Neurological: Negative for dizziness, tingling, tremors, sensory change, speech change, focal weakness, seizures, loss of consciousness, weakness and headaches. Endo/Heme/Allergies: Negative for environmental allergies and polydipsia. Does not bruise/bleed easily. Psychiatric/Behavioral: Negative for depression, hallucinations, memory loss, substance abuse and suicidal ideas. The patient is not nervous/anxious and does not have insomnia.  Physical Exam: Vitals: BP 136/69  - Pulse 63  - Ht 5' 3 (1.6 m)  - Wt 113.4 kg  - BMI 44.29 kg/m? Physical ExamConstitutional:  Pain Diagram as documented by patient on Intake Questionnaire. General Inspection: Gen: A+O x 3 in NAD. Pleasant and well-appearing. Psych: Normal mood and affect. Responds appropriately to commands.Eyes: Anicteric. No discharge. EOMI.Resp: Breathing unlaboredCV: No varicosities noted.Extr: No c/c/eSkin: No lesions,  rashes or areas of skin breakdown appreciated over examined areas.Neurologic:Strength: Right Left Hip flexion: 5/5 5/5 Knee extension 5/5 5/5 Hip extension (prone) 4/5 4/5 Hip adduction (seated) 5/5 5/5 Knee flexion (seated) 5/5 5/5 Hip abduction (side lying)  4/5 4/5 Great Toe Extension:  5/5 5/5 Ankle dorsiflexion (heel walking) 5/5 5/5 Plantar Flexors (single leg calf raises):        5/5 5/5 * = limited by painSensation: Grossly intact to light touch bilateral lower limbs. Tone: Normal. No clonus. No fasciculations or atrophyReflexes: 2+ symmetric patellar, medial hamstring and achilles. Down going plantars bilaterally.Stance: No trendelenburg with normal balance and coordination. Single leg stance impaired bilaterally.Gait: Not antalgic, normal reciprocating heel to toe pattern, toe and heel walking normal. Tandem gait is with increased postural sway.Coordination: Rapid alternating movements of bilateral lower extremities intact. Musculoskeletal:  Inspection: Head forward posture, no significant scoliosis.  Taking the socks and shoes off is symmetric.Lumbar ROM Flexion with fingers to mid shins before low back pain, extension limited by low back pain, right side-bending produces back pain, left side-bending produces back pain, right rotation produces no pain, left rotation produces no pain. right oblique extension produces back pain, left oblique extension produces back pain.Hip ROM: supine PROM in Flexion/IR/ER: Right: 90/0/30, Left 80/-5/30.Tight hip flexors and hamstrings bilaterally.Palpation: There is no tenderness to palpation at the midline lumbar spine without crepitus or step-off. There is no tenderness in the lumbar paraspinals, gluteal muscles, PSIS areas, greater trochanters, ITB EXCEPT bilateral gluteal muscle weakness. Special: SLR negative bilaterally and slump test negative bilaterally. FADIR, FABER negative bilaterally. Core: Not tested.Radiology & Data Review: Xray of the lumbar spine dated  was reviewed by me and shows minimal degenerative change preservation of the disc height and alignment.Xray of the hips dated 10/21/2019 was reviewed by me and shows mild degenerative changes in both hips and SI joints.The official report was reviewed.Assessment & Plan: Chronic right-sided low back pain with right-sided sciaticaImpression:Corita Pruss is a 70 y.o. female past medical history of obesity and endometriosis who presents for evaluation of chronic, recurrent, currently asymptomatic axial low back pain with right posterior thigh radicular symptoms traditionally exacerbated with sitting and flexion.  She presents today after recent trial of oral steroids and initiation of Mobic relatively pain-free without significant functional limitation. Clarine exhibits limited spinal flexion and lack of lumbopelvic rhythmn from chronic discogenic pain.  She exhibits hip and core girdle weakness to benefit from therapeutic exercise in order to reduce the risk of symptom recurrence.Plan:-Imaging reviewed as above. No new imaging indicated. If symptoms fail to improve, would consider MRI of the lumbar spine at this time, consider should symptoms fail to improve with conservative management.-I provided a home exercise program and recommend physical therapy focusing on MDT, hip and core strength. This included side planks, plank progression, gluteal bridging, single leg stance hip abduction strengthening progression and bird-dog maneuver. Reiana elected for trial of home exercise program. I emphasized the need for long term compliance in home exercise program for long term self-management.-Activity modification suggested limiting exercise that exacerbates symptoms until neuromuscular control improves. Encouraged patient to obtain of moderate intensity aerobic exercise 5 days per week, walking or swimming as tolerated, for the multiple therapeutic benefits of exercise with 10% increases weekly from baseline toward goal.  Discouraged resistance training early in the morning and discouraged any seated resistance training to avoid unnecessary stress to the lumbar discs. Advised to change positions every 30 minutes to avoid unnecessary stress to the lumbar discs. -Recommend continued use of Tylenol, less than 3000 mg per day as needed for pain. Anti-inflammatories with food.  Encouraged ice for post-activity soreness. Topical muscle rubs as needed. Consider medrol dose pack for flares. Consider addition of gabapentin for chronic pain  symptoms. -Follow-up Return in about 3 months (around 02/05/2021) for f91f [place] . to monitor progress with exercise, response to medications, biomechanical deficits and discuss next steps including advanced imaging outlined above.Thank you, Rockfeld, Richarda Osmond, MD, for allowing me to participate in the care of your patient. Please do not hesitate to contact me with questions or concerns.This note is produced by voice recognition software. Please excuse typographical and grammatical errors I may have unintentionally  missed during my review.Visit Time: 40 minutesOn the day of this patient's encounter, a total of 61 minutes was personally spent by me.  This does not include any resident/fellow teaching time, or any time spent performing a procedural service.Scribed for Ihor Dow, MD by Ree Kida, medical scribe May 26, 2022The documentation recorded by the scribe accurately reflects the services I personally performed and the decisions made by me. I reviewed and confirmed all material entered and/or pre-charted by the scribe.

## 2020-11-06 NOTE — Progress Notes
Review of Systems Musculoskeletal: Positive for back pain (lower radiating down right leg (intermittent, not currently)). All other systems reviewed and are negative.

## 2020-11-06 NOTE — Assessment & Plan Note
Impression:Mikayla Bryant is a 70 y.o. female past medical history of obesity and endometriosis who presents for evaluation of chronic, recurrent, currently asymptomatic axial low back pain with right posterior thigh radicular symptoms traditionally exacerbated with sitting and flexion.  She presents today after recent trial of oral steroids and initiation of Mobic relatively pain-free without significant functional limitation. Mikayla Bryant exhibits limited spinal flexion and lack of lumbopelvic rhythmn from chronic discogenic pain.  She exhibits hip and core girdle weakness to benefit from therapeutic exercise in order to reduce the risk of symptom recurrence.Plan:-Imaging reviewed as above. No new imaging indicated. If symptoms fail to improve, would consider MRI of the lumbar spine at this time, consider should symptoms fail to improve with conservative management.-I provided a home exercise program and recommend physical therapy focusing on MDT, hip and core strength. This included side planks, plank progression, gluteal bridging, single leg stance hip abduction strengthening progression and bird-dog maneuver. Mikayla Bryant elected for trial of home exercise program. I emphasized the need for long term compliance in home exercise program for long term self-management.-Activity modification suggested limiting exercise that exacerbates symptoms until neuromuscular control improves. Encouraged patient to obtain of moderate intensity aerobic exercise 5 days per week, walking or swimming as tolerated, for the multiple therapeutic benefits of exercise with 10% increases weekly from baseline toward goal.  Discouraged resistance training early in the morning and discouraged any seated resistance training to avoid unnecessary stress to the lumbar discs. Advised to change positions every 30 minutes to avoid unnecessary stress to the lumbar discs. -Recommend continued use of Tylenol, less than 3000 mg per day as needed for pain. Anti-inflammatories with food.  Encouraged ice for post-activity soreness. Topical muscle rubs as needed. Consider medrol dose pack for flares. Consider addition of gabapentin for chronic pain symptoms. -Follow-up Return in about 3 months (around 02/05/2021) for f63f [place] . to monitor progress with exercise, response to medications, biomechanical deficits and discuss next steps including advanced imaging outlined above.

## 2020-11-26 ENCOUNTER — Ambulatory Visit: Admit: 2020-11-26 | Payer: PRIVATE HEALTH INSURANCE

## 2020-12-02 ENCOUNTER — Inpatient Hospital Stay: Admit: 2020-12-02 | Discharge: 2020-12-02 | Payer: PRIVATE HEALTH INSURANCE

## 2020-12-02 DIAGNOSIS — I517 Cardiomegaly: Secondary | ICD-10-CM

## 2020-12-10 MED ORDER — ATORVASTATIN 20 MG TABLET
20 | ORAL_TABLET | ORAL | 2 refills | 90.00000 days | Status: AC
Start: 2020-12-10 — End: 2021-06-10

## 2021-01-13 MED ORDER — BUDESONIDE-FORMOTEROL HFA 160 MCG-4.5 MCG/ACTUATION AEROSOL INHALER
160-4.5 | Freq: Two times a day (BID) | RESPIRATORY_TRACT | 12 refills | 30.00000 days | Status: AC
Start: 2021-01-13 — End: ?

## 2021-01-13 MED ORDER — ALBUTEROL SULFATE HFA 90 MCG/ACTUATION AEROSOL INHALER
90 | Freq: Four times a day (QID) | RESPIRATORY_TRACT | 7 refills | 25.00000 days | Status: AC | PRN
Start: 2021-01-13 — End: ?

## 2021-01-18 MED ORDER — METFORMIN ER 500 MG TABLET,EXTENDED RELEASE 24 HR
500 | ORAL_TABLET | ORAL | 2 refills | 90.00000 days | Status: AC
Start: 2021-01-18 — End: 2021-07-16

## 2021-01-19 ENCOUNTER — Encounter: Admit: 2021-01-19 | Payer: PRIVATE HEALTH INSURANCE | Attending: Vascular and Interventional Radiology

## 2021-01-25 MED ORDER — LISINOPRIL 20 MG TABLET
20 | ORAL_TABLET | Freq: Every day | ORAL | 2 refills | 90.00000 days | Status: AC
Start: 2021-01-25 — End: ?

## 2021-02-16 MED ORDER — MELOXICAM 7.5 MG TABLET
7.5 | ORAL_TABLET | ORAL | 4 refills | 30.00000 days | Status: AC
Start: 2021-02-16 — End: 2021-06-08

## 2021-02-23 ENCOUNTER — Ambulatory Visit: Admit: 2021-02-23 | Payer: PRIVATE HEALTH INSURANCE | Attending: Physical Medicine and Rehabilitation

## 2021-06-08 MED ORDER — MELOXICAM 7.5 MG TABLET
7.5 | ORAL_TABLET | ORAL | 3 refills | 30.00000 days | Status: AC
Start: 2021-06-08 — End: 2021-09-06

## 2021-06-10 MED ORDER — ATORVASTATIN 20 MG TABLET
20 | ORAL_TABLET | ORAL | 2 refills | 90.00000 days | Status: AC
Start: 2021-06-10 — End: ?

## 2021-07-16 ENCOUNTER — Encounter: Admit: 2021-07-16 | Payer: PRIVATE HEALTH INSURANCE | Attending: Internal Medicine

## 2021-07-16 MED ORDER — METFORMIN ER 500 MG TABLET,EXTENDED RELEASE 24 HR
500 | ORAL_TABLET | ORAL | 2 refills | 90.00000 days | Status: AC
Start: 2021-07-16 — End: ?

## 2021-09-05 ENCOUNTER — Encounter: Admit: 2021-09-05 | Payer: PRIVATE HEALTH INSURANCE | Attending: Internal Medicine

## 2021-09-06 MED ORDER — MELOXICAM 7.5 MG TABLET
7.5 | ORAL_TABLET | ORAL | 3 refills | 30.00000 days | Status: AC
Start: 2021-09-06 — End: ?

## 2021-10-09 ENCOUNTER — Encounter (HOSPITAL_BASED_OUTPATIENT_CLINIC_OR_DEPARTMENT_OTHER): Payer: Self-pay | Admitting: Emergency Medicine

## 2021-10-09 ENCOUNTER — Observation Stay (HOSPITAL_BASED_OUTPATIENT_CLINIC_OR_DEPARTMENT_OTHER)
Admission: EM | Admit: 2021-10-09 | Discharge: 2021-10-10 | Disposition: A | Discharge status: Home / Self Care | Payer: Medicare HMO | Attending: Cardiology | Admitting: Cardiology

## 2021-10-09 ENCOUNTER — Other Ambulatory Visit: Payer: Self-pay

## 2021-10-09 ENCOUNTER — Emergency Department (HOSPITAL_BASED_OUTPATIENT_CLINIC_OR_DEPARTMENT_OTHER): Payer: Medicare HMO

## 2021-10-09 DIAGNOSIS — Z7951 Long term (current) use of inhaled steroids: Secondary | ICD-10-CM | POA: Diagnosis not present

## 2021-10-09 DIAGNOSIS — Z6841 Body Mass Index (BMI) 40.0 and over, adult: Secondary | ICD-10-CM | POA: Diagnosis not present

## 2021-10-09 DIAGNOSIS — I10 Essential (primary) hypertension: Secondary | ICD-10-CM | POA: Insufficient documentation

## 2021-10-09 DIAGNOSIS — R55 Syncope and collapse: Secondary | ICD-10-CM | POA: Insufficient documentation

## 2021-10-09 DIAGNOSIS — I4892 Unspecified atrial flutter: Secondary | ICD-10-CM | POA: Diagnosis not present

## 2021-10-09 DIAGNOSIS — Z79899 Other long term (current) drug therapy: Secondary | ICD-10-CM | POA: Diagnosis not present

## 2021-10-09 DIAGNOSIS — I4891 Unspecified atrial fibrillation: Secondary | ICD-10-CM | POA: Insufficient documentation

## 2021-10-09 DIAGNOSIS — Z791 Long term (current) use of non-steroidal anti-inflammatories (NSAID): Secondary | ICD-10-CM | POA: Insufficient documentation

## 2021-10-09 DIAGNOSIS — Z87891 Personal history of nicotine dependence: Secondary | ICD-10-CM | POA: Diagnosis not present

## 2021-10-09 DIAGNOSIS — J45909 Unspecified asthma, uncomplicated: Secondary | ICD-10-CM | POA: Insufficient documentation

## 2021-10-09 DIAGNOSIS — R42 Dizziness and giddiness: Secondary | ICD-10-CM | POA: Diagnosis not present

## 2021-10-09 DIAGNOSIS — R6884 Jaw pain: Secondary | ICD-10-CM | POA: Insufficient documentation

## 2021-10-09 DIAGNOSIS — R9431 Abnormal electrocardiogram [ECG] [EKG]: Secondary | ICD-10-CM | POA: Diagnosis not present

## 2021-10-09 DIAGNOSIS — I959 Hypotension, unspecified: Secondary | ICD-10-CM | POA: Diagnosis not present

## 2021-10-09 DIAGNOSIS — E1169 Type 2 diabetes mellitus with other specified complication: Secondary | ICD-10-CM | POA: Insufficient documentation

## 2021-10-09 DIAGNOSIS — I700 Atherosclerosis of aorta: Secondary | ICD-10-CM | POA: Diagnosis not present

## 2021-10-09 DIAGNOSIS — Z7901 Long term (current) use of anticoagulants: Secondary | ICD-10-CM | POA: Diagnosis not present

## 2021-10-09 DIAGNOSIS — I471 Supraventricular tachycardia: Secondary | ICD-10-CM | POA: Diagnosis not present

## 2021-10-09 DIAGNOSIS — R7303 Prediabetes: Secondary | ICD-10-CM

## 2021-10-09 DIAGNOSIS — I7789 Other specified disorders of arteries and arterioles: Secondary | ICD-10-CM | POA: Insufficient documentation

## 2021-10-09 DIAGNOSIS — E8801 Alpha-1-antitrypsin deficiency: Secondary | ICD-10-CM | POA: Diagnosis not present

## 2021-10-09 DIAGNOSIS — E785 Hyperlipidemia, unspecified: Secondary | ICD-10-CM | POA: Insufficient documentation

## 2021-10-09 DIAGNOSIS — Z7984 Long term (current) use of oral hypoglycemic drugs: Secondary | ICD-10-CM | POA: Diagnosis not present

## 2021-10-09 DIAGNOSIS — I2510 Atherosclerotic heart disease of native coronary artery without angina pectoris: Secondary | ICD-10-CM | POA: Insufficient documentation

## 2021-10-09 DIAGNOSIS — R002 Palpitations: Secondary | ICD-10-CM | POA: Insufficient documentation

## 2021-10-09 DIAGNOSIS — I289 Disease of pulmonary vessels, unspecified: Secondary | ICD-10-CM | POA: Insufficient documentation

## 2021-10-09 DIAGNOSIS — R079 Chest pain, unspecified: Secondary | ICD-10-CM | POA: Diagnosis present

## 2021-10-09 DIAGNOSIS — R0789 Other chest pain: Secondary | ICD-10-CM | POA: Diagnosis not present

## 2021-10-09 HISTORY — DX: Neoplasm of unspecified behavior of unspecified site: D49.9

## 2021-10-09 LAB — URINALYSIS, ROUTINE W REFLEX MICROSCOPIC
Bilirubin Urine: NEGATIVE
Glucose, UA: NEGATIVE mg/dL
Hgb urine dipstick: NEGATIVE
Ketones, ur: 20 mg/dL — AB
Leukocytes,Ua: NEGATIVE
Nitrite: NEGATIVE
Protein, ur: NEGATIVE mg/dL
Specific Gravity, Urine: 1.016 (ref 1.005–1.030)
pH: 5 (ref 5.0–8.0)

## 2021-10-09 LAB — CBC WITH DIFFERENTIAL/PLATELET
Abs Immature Granulocytes: 0.05 10*3/uL (ref 0.00–0.07)
Basophils Absolute: 0.1 10*3/uL (ref 0.0–0.1)
Basophils Relative: 0 %
Eosinophils Absolute: 0.1 10*3/uL (ref 0.0–0.5)
Eosinophils Relative: 1 %
HCT: 41.2 % (ref 36.0–46.0)
Hemoglobin: 12.8 g/dL (ref 12.0–15.0)
Immature Granulocytes: 0 %
Lymphocytes Relative: 30 %
Lymphs Abs: 3.3 10*3/uL (ref 0.7–4.0)
MCH: 28.3 pg (ref 26.0–34.0)
MCHC: 31.1 g/dL (ref 30.0–36.0)
MCV: 91.2 fL (ref 80.0–100.0)
Monocytes Absolute: 1 10*3/uL (ref 0.1–1.0)
Monocytes Relative: 9 %
Neutro Abs: 6.7 10*3/uL (ref 1.7–7.7)
Neutrophils Relative %: 60 %
Platelets: 392 10*3/uL (ref 150–400)
RBC: 4.52 MIL/uL (ref 3.87–5.11)
RDW: 15.2 % (ref 11.5–15.5)
WBC: 11.2 10*3/uL — ABNORMAL HIGH (ref 4.0–10.5)
nRBC: 0 % (ref 0.0–0.2)

## 2021-10-09 LAB — BASIC METABOLIC PANEL
Anion gap: 13 (ref 5–15)
BUN: 24 mg/dL — ABNORMAL HIGH (ref 8–23)
CO2: 22 mmol/L (ref 22–32)
Calcium: 10.2 mg/dL (ref 8.9–10.3)
Chloride: 108 mmol/L (ref 98–111)
Creatinine, Ser: 0.88 mg/dL (ref 0.44–1.00)
GFR, Estimated: >60 mL/min (ref >60)
Glucose, Bld: 210 mg/dL — ABNORMAL HIGH (ref 70–99)
Potassium: 4 mmol/L (ref 3.5–5.1)
Sodium: 143 mmol/L (ref 135–145)

## 2021-10-09 LAB — TROPONIN I (HIGH SENSITIVITY)
Troponin I (High Sensitivity): 226 ng/L (ref <18)
Troponin I (High Sensitivity): 180 ng/L (ref <18)

## 2021-10-09 MED ORDER — MOMETASONE FURO-FORMOTEROL FUM 200-5 MCG/ACT IN AERO
2.0000 | INHALATION_SPRAY | Freq: Two times a day (BID) | RESPIRATORY_TRACT | Status: DC
  Administered 2021-10-09: 2 via RESPIRATORY_TRACT
  Filled 2021-10-09: qty 8.8

## 2021-10-09 MED ORDER — NITROGLYCERIN 0.4 MG SL SUBL
0.8000 mg | SUBLINGUAL_TABLET | Freq: Once | SUBLINGUAL | Status: AC
  Administered 2021-10-10: 0.8 mg via SUBLINGUAL
  Filled 2021-10-09: qty 2

## 2021-10-09 MED ORDER — NITROGLYCERIN 0.4 MG SL SUBL
0.8000 mg | SUBLINGUAL_TABLET | SUBLINGUAL | Status: DC | PRN
Start: 2021-10-09 — End: 2021-10-09

## 2021-10-09 MED ORDER — SODIUM CHLORIDE 0.9 % IV BOLUS
1000.0000 mL | Freq: Once | INTRAVENOUS | Status: AC
Start: 2021-10-09 — End: 2021-10-09
  Administered 2021-10-09: 1000 mL via INTRAVENOUS

## 2021-10-09 MED ORDER — ADENOSINE 6 MG/2ML IV SOLN
INTRAVENOUS | Status: AC
  Administered 2021-10-09: 12 mg
  Filled 2021-10-09: qty 2

## 2021-10-09 MED ORDER — FLECAINIDE ACETATE 50 MG PO TABS
50.0000 mg | ORAL_TABLET | Freq: Two times a day (BID) | ORAL | Status: DC
  Administered 2021-10-09 – 2021-10-10 (×2): 50 mg via ORAL
  Filled 2021-10-09 (×2): qty 1

## 2021-10-09 MED ORDER — SODIUM CHLORIDE 0.9 % IV BOLUS
500.0000 mL | Freq: Once | INTRAVENOUS | Status: AC
  Administered 2021-10-09: 500 mL via INTRAVENOUS

## 2021-10-09 MED ORDER — ADENOSINE 6 MG/2ML IV SOLN
INTRAVENOUS | Status: AC
  Administered 2021-10-09: 6 mg
  Filled 2021-10-09: qty 4

## 2021-10-09 MED ORDER — METOPROLOL SUCCINATE ER 50 MG PO TB24
50.0000 mg | ORAL_TABLET | Freq: Every day | ORAL | Status: DC
Start: 2021-10-09 — End: 2021-10-10
  Administered 2021-10-09 – 2021-10-10 (×2): 50 mg via ORAL
  Filled 2021-10-09 (×3): qty 1

## 2021-10-09 MED ORDER — ACETAMINOPHEN 325 MG PO TABS: 650.0000 mg | ORAL_TABLET | ORAL | Status: DC | PRN

## 2021-10-09 MED ORDER — ETOMIDATE 2 MG/ML IV SOLN
INTRAVENOUS | Status: AC
  Administered 2021-10-09: 10 mg
  Filled 2021-10-09: qty 10

## 2021-10-09 MED ORDER — ONDANSETRON HCL 4 MG/2ML IJ SOLN: 4.0000 mg | Freq: Four times a day (QID) | INTRAMUSCULAR | Status: DC | PRN

## 2021-10-09 MED ORDER — ATORVASTATIN CALCIUM 10 MG PO TABS
20.0000 mg | ORAL_TABLET | Freq: Every day | ORAL | Status: DC
  Administered 2021-10-09: 20 mg via ORAL
  Filled 2021-10-09: qty 2

## 2021-10-09 MED ORDER — APIXABAN 5 MG PO TABS
5.0000 mg | ORAL_TABLET | Freq: Two times a day (BID) | ORAL | Status: DC
  Administered 2021-10-09 – 2021-10-10 (×3): 5 mg via ORAL
  Filled 2021-10-09: qty 2
  Filled 2021-10-09 (×2): qty 1

## 2021-10-09 NOTE — ED Notes (Signed)
RT note: Pt. s/u for Cardioversion, all Emergency equipment available at bedside. ?

## 2021-10-09 NOTE — H&P (Signed)
?Cardiology Admission History and Physical:  ? ?Patient ID: Maria Mcguire ?MRN: [id number]; DOB: 1950/11/04  ? ?Admission date: 10/09/2021 ? ?PCP:  System, Provider Not In ?  ?[facility] Providers ?Cardiologist:  None      ? ? ?Chief Complaint: Rapidheartrate chest pain jaw pain dizziness ? ?Patient Profile:  ? ?Maria Mcguire is a 71 y.o. female with hypertension hyperlipidemia morbid obesity who is being seen 10/09/2021 for the evaluation of atrial flutter one-to-one conduction with heart rate of 288 bpm. ? ?History of Present Illness:  ? ?Ms. Ahles 71 year old female with hypertension hyperlipidemia, morbid obesity visiting here from [place].  Traveled by airplane on Wednesday.  She also has alpha 1 antitrypsin deficiency.  Remote smoker.  She is on Symbicort and rescue albuterol. ? ?She had woken up last night and felt dizzy, jaw pain chest pain felt her heart pounding.  She went down to the recliner tried to rest, did not feel much better.  In the morning she still felt poorly, tried to go up the stairs but ended up having to stop midway because she was so short of breath. ? ?Over the past few months however she has felt some chest discomfort she states when going up hills or exerting herself.  She does not remember feeling palpitations. ? ?Here in the ER she was driven by her husband.  She is here in [place] since Wednesday flying and for her granddaughters acrobatic competition, she is 75 years old. ? ?In the emergency room she was found to have a heart rate of 288 bpm.  Adenosine was administered and showed atrial flutter underneath.  She was appropriately cardioverted after etomidate by the ER staff.  Successful conversion to sinus rhythm.  Slightly hypotensive following the procedure was given fluids. ? 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 ? ?She is not on AV nodal blocking agents. ? ? ?Past Medical History:  ?Diagnosis Date  ? Tumor   ? benign abdominal  ? ? ?Past Surgical History:  ?Procedure Laterality Date  ? ABDOMINAL HYSTERECTOMY    ? ABDOMINAL SURGERY    ? APPENDECTOMY    ? CHOLECYSTECTOMY    ?  ? ?Medications Prior to Admission: ?Prior to Admission medications   ?Not on File  ?  ? ?Allergies:    ?Allergies  ?Allergen Reactions  ? Nickel Rash  ? ? ?Social History:   ?Social History  ? ?Socioeconomic History  ? Marital status: Married  ?  Spouse name: Not on file  ? Number of children: Not on file  ? Years of education: Not on file  ? Highest education level: Not on file  ?Occupational History  ? Not on file  ?Tobacco Use  ? Smoking status: Not on file  ? Smokeless tobacco: Not on file  ?Substance and Sexual Activity  ? Alcohol use: Not on file  ? Drug use: Not on file  ? Sexual activity: Not on file  ?Other Topics Concern  ? Not on file  ?Social History Narrative  ? Not on file  ? ?Social Determinants of Health  ? ?Financial Resource Strain: Not on file  ?Food Insecurity: Not on file  ?Transportation Needs: Not on file  ?Physical Activity: Not on file  ?Stress: Not on file  ?Social Connections: Not on file  ?Intimate Partner Violence: Not on file  ?  ?  Family History:   ?The patient's family history is not on file.   ? ?ROS:  ?Please see the history of present illness.  ?All other ROS reviewed and negative.    ? ?Physical Exam/Data:  ? ?Vitals:  ? 10/09/21 1200 10/09/21 1230 10/09/21 1243 10/09/21 1333  ?BP: (!) 99/54 (!) 98/57  112/64  ?Pulse: 70 71  66  ?Resp: '14 15  16  '$ ?Temp:   98.3 ?F (36.8 ?C) 98.9 ?F (37.2 ?C)  ?TempSrc:   Oral Oral  ?SpO2: 95% 95%  99%  ?Weight:    111.1 kg  ?Height:    '5\' 3"'$  (1.6 m)  ? ? ?Intake/Output Summary (Last 24 hours) at 10/09/2021 1535 ?Last data filed at 10/09/2021 1215 ?Gross per 24 hour  ?Intake 1500 ml  ?Output --  ?Net 1500 ml  ? ? ?  10/09/2021  ?  1:33 PM 10/09/2021   ?  9:34 AM  ?Last 3 Weights  ?Weight (lbs) 245 lb 240 lb  ?Weight (kg) 111.131 kg 108.863 kg  ?   ?Body mass index is 43.4 kg/m?.  ?General:  Well nourished, well developed, in no acute distress ?HEENT: normal ?Neck: no JVD ?Vascular: No carotid bruits; Distal pulses 2+ bilaterally   ?Cardiac:  normal S1, S2; RRR; no murmur  ?Lungs:  clear to auscultation bilaterally, no wheezing, rhonchi or rales  ?Abd: soft, nontender, no hepatomegaly  ?Ext: no edema ?Musculoskeletal:  No deformities, BUE and BLE strength normal and equal ?Skin: warm and dry  ?Neuro:  CNs 2-12 intact, no focal abnormalities noted ?Psych:  Normal affect  ? ? ?EKG:  The ECG that was done  was personally reviewed and demonstrates  ? ? ? ? ? ? ? ? ?Relevant CV Studies: ?None ? ?Laboratory Data: ? ?High Sensitivity Troponin:   ?Recent Labs  ?Lab 10/09/21 ?0940 10/09/21 ?1133  ?TROPONINIHS 226* 180*  ?    ?Chemistry ?Recent Labs  ?Lab 10/09/21 ?0940  ?NA 143  ?K 4.0  ?CL 108  ?CO2 22  ?GLUCOSE 210*  ?BUN 24*  ?CREATININE 0.88  ?CALCIUM 10.2  ?GFRNONAA >60  ?ANIONGAP 13  ?  ?No results for input(s): PROT, ALBUMIN, AST, ALT, ALKPHOS, BILITOT in the last 168 hours. ?Lipids No results for input(s): CHOL, TRIG, HDL, LABVLDL, LDLCALC, CHOLHDL in the last 168 hours. ?Hematology ?Recent Labs  ?Lab 10/09/21 ?0940  ?WBC 11.2*  ?RBC 4.52  ?HGB 12.8  ?HCT 41.2  ?MCV 91.2  ?MCH 28.3  ?MCHC 31.1  ?RDW 15.2  ?PLT 392  ? ?Thyroid No results for input(s): TSH, FREET4 in the last 168 hours. ?BNPNo results for input(s): BNP, PROBNP in the last 168 hours.  ?DDimer No results for input(s): DDIMER in the last 168 hours. ? ? ?Radiology/Studies:  ?DG Chest Port 1 View ? ?Result Date: 10/09/2021 ?CLINICAL DATA:  Chest pain, tachycardia EXAM: PORTABLE CHEST 1 VIEW COMPARISON:  None. FINDINGS: Overlying cardiac pacer pads. The heart size and mediastinal contours are within normal limits. Aortic atherosclerosis. Both lungs are clear. The visualized skeletal structures are  unremarkable. IMPRESSION: No active disease. Electronically Signed   By: Davina Poke D.O.   On: 10/09/2021 11:18   ? ? ?Assessment and Plan:  ? ?71 year old with one-to-one conducted atrial flutter at heart rate of 288 bpm with accompanied chest pain and shortness of breath visiting from [place]. ? ?Atrial flutter one-to-one conduction-heart rate 288 bpm ?- Blood pressures after cardioversion in the emergency department were slightly soft.  They are improving. ?-Currently  in sinus rhythm.  See EKGs as above.  I discussed her case with Dr. Curt Bears of electrophysiology.  We will go ahead and give her flecainide 50 mg twice a day with Toprol XL 50 mg as well as an AV nodal blocking agent. ?-We have also started Eliquis 5 mg twice a day given recent cardioversion and atrial flutter.  Expressed to her stroke benefits, risks of bleeding. ?-When she gets back to [place], she should establish with cardiology for further monitoring. ?-Check electrolytes, TSH ? ?Chest pain/jaw pain/elevated troponin (226-180) ?- Elevated troponin is likely secondary to rapid heartbeat/demand ischemia in the setting of rapid atrial flutter.  She did however have jaw pain and chest pain.  She is noted this in the past few weeks when walking up hills. ?- We will go ahead and check a coronary CT scan to ensure that she does not have any evidence of significant flow-limiting CAD. ?-CT should be able to do her today. ? ?Diabetes with hyperlipidemia ?- Continue with atorvastatin 20 ?-Random glucose 210 ? ?Morbid obesity ?- Continue to encourage weight loss.  BMI 43 ? ? ? ? ? ?Risk Assessment/Risk Scores:  ?  ? ? ? ?CHA2DS2-VASc Score = 4  ? This indicates a 4.8% annual risk of stroke. ?The patient's score is based upon: ?CHF History: 0 ?HTN History: 1 ?Diabetes History: 1 ?Stroke History: 0 ?Vascular Disease History: 0 ?Age Score: 1 ?Gender Score: 1 ?  ? ? ? ?Severity of Illness: ?The appropriate patient status for this patient is OBSERVATION.  Observation status is judged to be reasonable and necessary in order to provide the required intensity of service to ensure the patient's safety. The patient's presenting symptoms, physical exam findings, and initia

## 2021-10-09 NOTE — ED Notes (Signed)
Pt resting in bed with husband at bedside. Pt is aware of plan to admit and to be transferred to another hospital. Pt denies any needs at this time. Prefers to keep shoes on. Is comfortable and denies anything at this time. Call belll in reach  ?

## 2021-10-09 NOTE — ED Provider Notes (Signed)
?[facility] EMERGENCY DEPT ?Provider Note ? ? ?CSN: [id number] ?Arrival date & time: 10/09/21  9833 ? ?  ? ?History ? ?Chief Complaint  ?Patient presents with  ? Tachycardia  ? ? ?Maria Mcguire is a 71 y.o. female. ? ?Presents with chest pain, palpitations.  Symptoms started at 11 PM last night.  She went to bed thinking a bit better, however symptoms persisted this morning.  Had an episode of near syncope lightheadedness dizziness this morning.  Continued to have chest heaviness and palpitations and presented to the ER.  Denies any prior cardiac event.  She is visiting this area from [place].  No reports of any fevers or cough or vomiting or diarrhea. ? ? ?  ? ?Home Medications ?Prior to Admission medications   ?Not on File  ?   ? ?Allergies    ?Patient has no known allergies.   ? ?Review of Systems   ?Review of Systems  ?Constitutional:  Negative for fever.  ?HENT:  Negative for ear pain.   ?Eyes:  Negative for pain.  ?Respiratory:  Negative for cough.   ?Cardiovascular:  Positive for chest pain and palpitations.  ?Gastrointestinal:  Negative for abdominal pain.  ?Genitourinary:  Negative for flank pain.  ?Musculoskeletal:  Negative for back pain.  ?Skin:  Negative for rash.  ?Neurological:  Negative for headaches.  ? ?Physical Exam ?Updated Vital Signs ?BP (!) 89/52   Pulse 78   Temp (!) 97.5 ?F (36.4 ?C) (Oral)   Resp 18   Ht '5\' 3"'$  (1.6 m)   Wt 108.9 kg   SpO2 95%   BMI 42.51 kg/m?  ?Physical Exam ?Constitutional:   ?   Appearance: Normal appearance.  ?HENT:  ?   Head: Normocephalic.  ?   Nose: Nose normal.  ?Eyes:  ?   Extraocular Movements: Extraocular movements intact.  ?Cardiovascular:  ?   Rate and Rhythm: Tachycardia present.  ?Pulmonary:  ?   Effort: Pulmonary effort is normal.  ?Musculoskeletal:     ?   General: Normal range of motion.  ?   Cervical back: Normal range of motion.  ?Neurological:  ?   General: No focal deficit present.  ?   Mental Status: She is alert and  oriented to person, place, and time. Mental status is at baseline.  ?   Cranial Nerves: No cranial nerve deficit.  ?   Motor: No weakness.  ? ? ?ED Results / Procedures / Treatments   ?Labs ?(all labs ordered are listed, but only abnormal results are displayed) ?Labs Reviewed  ?CBC WITH DIFFERENTIAL/PLATELET - Abnormal; Notable for the following components:  ?    Result Value  ? WBC 11.2 (*)   ? All other components within normal limits  ?BASIC METABOLIC PANEL - Abnormal; Notable for the following components:  ? Glucose, Bld 210 (*)   ? BUN 24 (*)   ? All other components within normal limits  ?TROPONIN I (HIGH SENSITIVITY) - Abnormal; Notable for the following components:  ? Troponin I (High Sensitivity) 226 (*)   ? All other components within normal limits  ?URINALYSIS, ROUTINE W REFLEX MICROSCOPIC  ?TROPONIN I (HIGH SENSITIVITY)  ? ? ?EKG ?EKG Interpretation ? ?Date/Time:  Saturday October 09 2021 10:16:27 EDT ?Ventricular Rate:  83 ?PR Interval:  133 ?QRS Duration: 97 ?QT Interval:  342 ?QTC Calculation: 402 ?R Axis:   80 ?Text Interpretation: Sinus rhythm Borderline repolarization abnormality Confirmed by Thamas Jaegers ([id number]) on 10/09/2021 10:19:49 AM ? ?Radiology ?No results  found. ? ?Procedures ?Marland KitchenCritical Care ?Performed by: Luna Fuse, MD ?Authorized by: Luna Fuse, MD  ? ?Critical care provider statement:  ?  Critical care time (minutes):  40 ?  Critical care time was exclusive of:  Separately billable procedures and treating other patients and teaching time ?  Critical care was necessary to treat or prevent imminent or life-threatening deterioration of the following conditions:  Cardiac failure  ? ? ?Medications Ordered in ED ?Medications  ?apixaban (ELIQUIS) tablet 5 mg (5 mg Oral Given 10/09/21 1002)  ?adenosine (ADENOCARD) 6 MG/2ML injection (12 mg  Given 10/09/21 0938)  ?adenosine (ADENOCARD) 6 MG/2ML injection (6 mg  Given 10/09/21 0936)  ?sodium chloride 0.9 % bolus 500 mL (0 mLs Intravenous Stopped  10/09/21 1000)  ?etomidate (AMIDATE) 2 MG/ML injection (10 mg  Given 10/09/21 1005)  ?sodium chloride 0.9 % bolus 1,000 mL (1,000 mLs Intravenous New Bag/Given 10/09/21 1037)  ? ? ?ED Course/ Medical Decision Making/ A&P ?  ?                        ?Medical Decision Making ?Amount and/or Complexity of Data Reviewed ?Labs: ordered. ?Radiology: ordered. ? ?Risk ?Prescription drug management. ?Decision regarding hospitalization. ? ? ?Chart review shows visit with pulmonology August 30, 2021. ? ?History obtained from family member at bedside as well. ? ?Patient presented with severe tachycardia heart rate about 288 bpm narrow and regular.  I was called immediately to her bedside. ? ?We attempted Valsalva maneuver with heart rate improving to about 150 to 160 bpm and regular. ? ?Adenosine was given 6 mg without any effect.  12 mg given showing a sinus pause with underlying flutter waves. ? ?At this point I discussed risks and benefits of cardioversion with the patient and she opted to be cardioverted.  She was given Eliquis 5 mg dosage here in the ER, and then cardioverted after sedation with 10 mg of etomidate. ? ?She converted to sinus rhythm, however the blood pressure dropped down to 85 systolic while in sinus rhythm.  She was given additional liter bolus of fluids. ? ?Troponin returned elevated at 288, I suspect due to cardiac strain. ? ?Case discussed with on-call cardiologist Dr.Skairn, accepted to his service at [facility]. ? ? ? ? ? ? ? ?Final Clinical Impression(s) / ED Diagnoses ?Final diagnoses:  ?Atrial flutter with rapid ventricular response ([place])  ?Hypotensive episode  ? ? ?Rx / DC Orders ?ED Discharge Orders   ? ? None  ? ?  ? ? ?  ?Luna Fuse, MD ?10/09/21 1113 ? ?

## 2021-10-09 NOTE — Sedation Documentation (Signed)
Synchronized Cardioversion done using 150 J. ?

## 2021-10-09 NOTE — ED Notes (Signed)
Called [facility] and advised that we have a bed ready, [facility] [id number] ?

## 2021-10-09 NOTE — ED Triage Notes (Signed)
Pt arrives with tachycardia.  Pt began having heaviness and had a feeling like "throat and chest were clogging up".   ?

## 2021-10-10 ENCOUNTER — Encounter (HOSPITAL_COMMUNITY): Payer: Self-pay | Admitting: Cardiology

## 2021-10-10 ENCOUNTER — Observation Stay (HOSPITAL_BASED_OUTPATIENT_CLINIC_OR_DEPARTMENT_OTHER): Payer: Medicare HMO

## 2021-10-10 DIAGNOSIS — R9431 Abnormal electrocardiogram [ECG] [EKG]: Secondary | ICD-10-CM | POA: Diagnosis not present

## 2021-10-10 DIAGNOSIS — I700 Atherosclerosis of aorta: Secondary | ICD-10-CM

## 2021-10-10 DIAGNOSIS — I4892 Unspecified atrial flutter: Secondary | ICD-10-CM | POA: Diagnosis not present

## 2021-10-10 DIAGNOSIS — I10 Essential (primary) hypertension: Secondary | ICD-10-CM

## 2021-10-10 DIAGNOSIS — E785 Hyperlipidemia, unspecified: Secondary | ICD-10-CM

## 2021-10-10 DIAGNOSIS — R7303 Prediabetes: Secondary | ICD-10-CM

## 2021-10-10 LAB — BASIC METABOLIC PANEL
Anion gap: 7 (ref 5–15)
BUN: 18 mg/dL (ref 8–23)
CO2: 25 mmol/L (ref 22–32)
Calcium: 8.7 mg/dL — ABNORMAL LOW (ref 8.9–10.3)
Chloride: 110 mmol/L (ref 98–111)
Creatinine, Ser: 0.66 mg/dL (ref 0.44–1.00)
GFR, Estimated: >60 mL/min (ref >60)
Glucose, Bld: 98 mg/dL (ref 70–99)
Potassium: 3.4 mmol/L — ABNORMAL LOW (ref 3.5–5.1)
Sodium: 142 mmol/L (ref 135–145)

## 2021-10-10 LAB — LIPID PANEL
Cholesterol: 96 mg/dL (ref 0–200)
HDL: 41 mg/dL (ref >40)
LDL Cholesterol: 43 mg/dL (ref 0–99)
Total CHOL/HDL Ratio: 2.3 RATIO
Triglycerides: 62 mg/dL (ref <150)
VLDL: 12 mg/dL (ref 0–40)

## 2021-10-10 LAB — ECHOCARDIOGRAM COMPLETE
AR max vel: 1.75 cm2
AV Area VTI: 1.97 cm2
AV Area mean vel: 1.94 cm2
AV Mean grad: 4.8 mmHg
AV Peak grad: 10.5 mmHg
Ao pk vel: 1.62 m/s
Area-P 1/2: 4.36 cm2
Height: 63 in
S' Lateral: 3.1 cm
Weight: 3918.9 oz

## 2021-10-10 LAB — HEMOGLOBIN A1C
Hgb A1c MFr Bld: 6.1 % — ABNORMAL HIGH (ref 4.8–5.6)
Mean Plasma Glucose: 128.37 mg/dL

## 2021-10-10 LAB — TSH: TSH: 1.062 u[IU]/mL (ref 0.350–4.500)

## 2021-10-10 LAB — HIV ANTIBODY (ROUTINE TESTING W REFLEX): HIV Screen 4th Generation wRfx: NONREACTIVE

## 2021-10-10 LAB — T4, FREE: Free T4: 1 ng/dL (ref 0.61–1.12)

## 2021-10-10 MED ORDER — ACETAMINOPHEN 325 MG PO TABS: 650.0000 mg | ORAL_TABLET | ORAL | Status: AC | PRN

## 2021-10-10 MED ORDER — POTASSIUM CHLORIDE CRYS ER 20 MEQ PO TBCR
40.0000 meq | EXTENDED_RELEASE_TABLET | Freq: Once | ORAL | Status: AC
  Administered 2021-10-10: 40 meq via ORAL
  Filled 2021-10-10: qty 2

## 2021-10-10 MED ORDER — METOPROLOL SUCCINATE ER 50 MG PO TB24: 50.0000 mg | ORAL_TABLET | Freq: Every day | ORAL | 3 refills | Status: AC

## 2021-10-10 MED ORDER — APIXABAN 5 MG PO TABS: 5.0000 mg | ORAL_TABLET | Freq: Two times a day (BID) | ORAL | 3 refills | Status: AC

## 2021-10-10 MED ORDER — ROSUVASTATIN CALCIUM 20 MG PO TABS
20.0000 mg | ORAL_TABLET | Freq: Every day | ORAL | Status: DC
Start: 2021-10-10 — End: 2021-10-10
  Administered 2021-10-10: 20 mg via ORAL
  Filled 2021-10-10: qty 1

## 2021-10-10 MED ORDER — IOHEXOL 350 MG/ML SOLN
100.0000 mL | Freq: Once | INTRAVENOUS | Status: AC | PRN
  Administered 2021-10-10: 100 mL via INTRAVENOUS

## 2021-10-10 MED ORDER — FLECAINIDE ACETATE 50 MG PO TABS
50.0000 mg | ORAL_TABLET | Freq: Two times a day (BID) | ORAL | 3 refills | Status: DC
Start: 2021-10-10 — End: 2022-12-26

## 2021-10-10 NOTE — Progress Notes (Signed)
Patient stable and discharged via wheelchair with family. No further questions and patient received Eliquis discount card ? ?

## 2021-10-10 NOTE — Plan of Care (Signed)
?  Problem: Clinical Measurements: ?Goal: Cardiovascular complication will be avoided ?Outcome: Progressing ?  ?Problem: Coping: ?Goal: Level of anxiety will decrease ?Outcome: Progressing ?  ?Problem: Elimination: ?Goal: Will not experience complications related to bowel motility ?Outcome: Progressing ?Goal: Will not experience complications related to urinary retention ?Outcome: Progressing ?  ?Problem: Pain Managment: ?Goal: General experience of comfort will improve ?Outcome: Progressing ?  ?Problem: Safety: ?Goal: Ability to remain free from injury will improve ?Outcome: Progressing ?  ?

## 2021-10-10 NOTE — Progress Notes (Addendum)
? ?Progress Note ? ?Patient Name: Maria Mcguire ?Date of Encounter: 10/10/2021 ? ?[facility] Cardiologist: Candee Furbish, MD  ? ?Subjective  ? ?She remains sinus in the 70s. Just back from [place]. Feeling well, no further chest pain. Brief discussion regarding home medications. ? ?Inpatient Medications  ?  ?Scheduled Meds: ? apixaban  5 mg Oral BID  ? atorvastatin  20 mg Oral QHS  ? flecainide  50 mg Oral Q12H  ? metoprolol succinate  50 mg Oral Daily  ? mometasone-formoterol  2 puff Inhalation BID  ? nitroGLYCERIN  0.8 mg Sublingual Once  ? ?Continuous Infusions: ? ?PRN Meds: ?acetaminophen, ondansetron (ZOFRAN) IV  ? ?Vital Signs  ?  ?Vitals:  ? 10/09/21 2050 10/10/21 0026 10/10/21 4665 10/10/21 9935  ?BP:  127/75 130/70 135/78  ?Pulse:  73 76   ?Resp:  16 16   ?Temp:  98.7 ?F (37.1 ?C) 98.7 ?F (37.1 ?C)   ?TempSrc:  Oral Oral   ?SpO2: 99% 95% 95%   ?Weight:   111.1 kg   ?Height:      ? ? ?Intake/Output Summary (Last 24 hours) at 10/10/2021 0816 ?Last data filed at 10/10/2021 0610 ?Gross per 24 hour  ?Intake 1860 ml  ?Output --  ?Net 1860 ml  ? ? ?  10/10/2021  ?  4:29 AM 10/09/2021  ?  1:33 PM 10/09/2021  ?  9:34 AM  ?Last 3 Weights  ?Weight (lbs) 244 lb 14.9 oz 245 lb 240 lb  ?Weight (kg) 111.1 kg 111.131 kg 108.863 kg  ?   ? ?Telemetry  ?  ?Sinus rhythm HR 70s - Personally Reviewed ? ?ECG  ?  ?Sinus rhythm HR  83 - Personally Reviewed ? ?Physical Exam  ? ?GEN: No acute distress.   ?Neck: No JVD ?Cardiac: RRR, no murmurs, rubs, or gallops.  ?Respiratory: Clear to auscultation bilaterally. ?GI: Soft, nontender, non-distended  ?MS: No edema; No deformity. ?Neuro:  Nonfocal  ?Psych: Normal affect  ? ?Labs  ?  ?High Sensitivity Troponin:   ?Recent Labs  ?Lab 10/09/21 ?0940 10/09/21 ?1133  ?TROPONINIHS 226* 180*  ?   ?Chemistry ?Recent Labs  ?Lab 10/09/21 ?0940 10/10/21 ?0304  ?NA 143 142  ?K 4.0 3.4*  ?CL 108 110  ?CO2 22 25  ?GLUCOSE 210* 98  ?BUN 24* 18  ?CREATININE 0.88 0.66  ?CALCIUM 10.2 8.7*  ?GFRNONAA  >60 >60  ?ANIONGAP 13 7  ?  ?Lipids  ?Recent Labs  ?Lab 10/10/21 ?0304  ?CHOL 96  ?TRIG 62  ?HDL 41  ?[place] 43  ?CHOLHDL 2.3  ?  ?Hematology ?Recent Labs  ?Lab 10/09/21 ?0940  ?WBC 11.2*  ?RBC 4.52  ?HGB 12.8  ?HCT 41.2  ?MCV 91.2  ?MCH 28.3  ?MCHC 31.1  ?RDW 15.2  ?PLT 392  ? ?Thyroid  ?Recent Labs  ?Lab 10/10/21 ?0304  ?TSH 1.062  ?FREET4 1.00  ?  ?BNPNo results for input(s): BNP, PROBNP in the last 168 hours.  ?DDimer No results for input(s): DDIMER in the last 168 hours.  ? ?Radiology  ?  ?DG Chest Port 1 View ? ?Result Date: 10/09/2021 ?CLINICAL DATA:  Chest pain, tachycardia EXAM: PORTABLE CHEST 1 VIEW COMPARISON:  None. FINDINGS: Overlying cardiac pacer pads. The heart size and mediastinal contours are within normal limits. Aortic atherosclerosis. Both lungs are clear. The visualized skeletal structures are unremarkable. IMPRESSION: No active disease. Electronically Signed   By: Davina Poke D.O.   On: 10/09/2021 11:18   ? ?Cardiac Studies  ? ?  CT coronary - today ? ?Echo - today ? ?Patient Profile  ?   ?71 y.o. female with hypertension hyperlipidemia morbid obesity who is being seen 10/09/2021 for the evaluation of atrial flutter one-to-one conduction with heart rate of 288 bpm. ? ?Assessment & Plan  ?  ?New onset atrial flutter 1:1 conduction at 288 bpm ?Cardioverted in the ER ?TSH and free T4 ?K 3.4 - will replace today with 40 mEq ?In consultation with EP, she was started on metoprolol and flecainide ?Will obtain echocardiogram today ? ? ?Need for anticoagulation ?Post-cardioversion - She was started on eliquis 5 mg BID ?Will need to continue at least 4 weeks. Studies today will help clarify her stroke risk (CAD, CHF, DM - added on an A1c).  ? ? ?Chest tightness ?Elevated HST ?- HST 226 --> 180 ?- post cardioversion EKG nonischemic ?- could be type II in the setting of rapid Aflutter ?- will obtain CT coronary today ? ? ?Hyperlipidemia ?10/10/2021: Cholesterol 96; HDL 41; LDL Cholesterol 43;  Triglycerides 62; VLDL 12 ?Continue 20 mg lipitor ? ? ?She is from [facility] and will transfer cardiology care to [facility].  ? ? ? ?For questions or updates, please contact [place] ?Please consult [email or web address] for contact info under  ? ?  ?   ?Signed, ?Ledora Bottcher, PA  ?10/10/2021, 8:16 AM   ? ?Personally seen and examined. Agree with above. ? ?CT scan shows only mild nonobstructive calcified plaque.  Aortic atherosclerosis.  Mildly dilated pulmonary artery which may be secondary to increased pulmonary pressures/obesity. ? ?Recommended sleep study as outpatient ?Recommend EP evaluation in [place] when she gets home. ?We will change her atorvastatin 20 mg over to rosuvastatin 20 mg, high intensity statin dose given her calcified coronary disease. ?Exercise treadmill test to observe for any arrhythmias on flecainide will be helpful when she gets back to [place]. ?Hemoglobin A1c 6.1.  Technically nondiabetic. ?Female, hypertensive, age greater than 65-CHADSVASc is 3.  Warrants anticoagulation. ? ?Awaiting echocardiogram.  If unremarkable, discharge. ? ?Had lengthy conversation with her family.  Her daughter does water PT at [employer]. ? ?Candee Furbish, MD ? ?

## 2021-10-10 NOTE — Discharge Summary (Addendum)
?Discharge Summary  ?  ?Patient ID: Maria Mcguire ?MRN: [id number]; DOB: [birth year] ? ?Admit date: 10/09/2021 ?Discharge date: 10/10/2021 ? ?PCP:  System, Provider Not In ?  ?[facility] Providers ?Cardiologist:  Candee Furbish, MD  ? ?Discharge Diagnoses  ?  ?Principal Problem: ?  Atrial flutter with rapid ventricular response ([place]) ?Active Problems: ?  Atrial flutter ([place]) ?  Hypertension ?  Hyperlipidemia ?  Prediabetes ? ? ?Diagnostic Studies/Procedures  ?  ?Echocardiogram 10/10/21 ?Reviewed by MD ? ?1. Left ventricular ejection fraction, by estimation, is 60 to 65%. The  ?left ventricle has normal function. The left ventricle has no regional  ?wall motion abnormalities. Left ventricular diastolic parameters were  ?normal.  ? 2. Right ventricular systolic function is normal. The right ventricular  ?size is normal.  ? 3. Left atrial size was mildly dilated.  ? 4. The mitral valve is normal in structure. No evidence of mitral valve  ?regurgitation. No evidence of mitral stenosis.  ? 5. The aortic valve is tricuspid. Aortic valve regurgitation is not  ?visualized. No aortic stenosis is present.  ? 6. The inferior vena cava is normal in size with greater than 50%  ?respiratory variability, suggesting right atrial pressure of 3 mmHg.  ?_____________ ?  ?CT coronary 10/10/21 ?IMPRESSION: ?1. Coronary calcium score of 60. This was 32 percentile for age and ?sex matched control. ?  ?2. Normal coronary origin with right dominance. ?  ?3.  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 ?  ?4. Mildly dilated pulmonary artery (likely secondary to increase ?pulmonary pressure as a result of obesity) ?  ?5.  Aortic atherosclerosis. ?  ?CAD-RADS 2. Mild non-obstructive CAD (25-49%). Consider ?non-atherosclerotic causes of chest pain. Consider preventive ?therapy and risk factor modification. ? ? ?History of Present Illness   ?  ?Maria Mcguire is a 71 y.o. female with HTN, HLD, morbid obesity  and new onset 1:1 atrial flutter.  ? ?Ms. Maria Mcguire 71 year old female with hypertension hyperlipidemia, morbid obesity visiting here from [place].  Traveled by airplane on Wednesday.  She also has alpha 1 antitrypsin deficiency.  Remote smoker.  She is on Symbicort and rescue albuterol. ?  ?She had woken up last night and felt dizzy, jaw pain chest pain felt her heart pounding.  She went down to the recliner tried to rest, did not feel much better.  In the morning she still felt poorly, tried to go up the stairs but ended up having to stop midway because she was so short of breath. ?  ?Over the past few months however she has felt some chest discomfort she states when going up hills or exerting herself.  She does not remember feeling palpitations. ?  ?Here in the ER she was driven by her husband.  She is here in [place] since Wednesday flying and for her granddaughters acrobatic competition, she is 22 years old. ?  ?In the emergency room she was found to have a heart rate of 288 bpm.  Adenosine was administered and showed atrial flutter underneath.  She was appropriately cardioverted after etomidate by the ER staff.  Successful conversion to sinus rhythm.  Slightly hypotensive following the procedure was given fluids. ?  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 ?  ?She is not on AV nodal blocking agents. ? ?Hospital Course  ?   ?  Consultants: none ? ?Atrial flutter with 1:1 conduction, HR 288 bpm ?New onset, associated with chest pain. Cardioverted in the ER to sinus rhythm. In curbside with EP, she was started on 50 mg flecainide along with 50 mg toprol. She remained in sinus rhythm overnight. Echocardiogram revealed normal LV function, mild LAE, and no significant valvular abnormalities. ? ? ?Chronic anticoagulation ?She was started on 5 mg  eliquis s/p cardioversion ?Studies today clarified her stroke risk ?Would recommend stopping her daily meloxicam ? ? ?Chest pain/jaw pain ?Elevated troponin ?HS troponin 226 --> 180 ?CT coronary today revealed mild nonobstructive CAD with calcified plaque in the LAD and RCA. Mildly dilated pulmonary artery likely increased PA pressure (obesity).  ? ? ?Hyperlipidemia with LDL goal < 70 ?10/10/2021: Cholesterol 96; HDL 41; LDL Cholesterol 43; Triglycerides 62; VLDL 12 ?Continue lipitor 20 mg ? ? ?Prediabetes  ?Random glucose 210, A1c 6.1%. ? ? ?She was seen and examined by Dr. Marlou Porch and deemed stable for discharge. She will follow up with cardiology in [facility]. ? ? ? ?Did the patient have an acute coronary syndrome (MI, NSTEMI, STEMI, etc) this admission?:  No                               ?Did the patient have a percutaneous coronary intervention (stent / angioplasty)?:  No.   ? ?   ?_____________ ? ?Discharge Vitals ?Blood pressure 127/79, pulse 70, temperature 98.5 ?F (36.9 ?C), temperature source Oral, resp. rate 18, height '5\' 3"'$  (1.6 m), weight 111.1 kg, SpO2 98 %.  ?Filed Weights  ? 10/09/21 0934 10/09/21 1333 10/10/21 0429  ?Weight: 108.9 kg 111.1 kg 111.1 kg  ? ? ?Labs & Radiologic Studies  ?  ?CBC ?Recent Labs  ?  10/09/21 ?0940  ?WBC 11.2*  ?NEUTROABS 6.7  ?HGB 12.8  ?HCT 41.2  ?MCV 91.2  ?PLT 392  ? ?Basic Metabolic Panel ?Recent Labs  ?  10/09/21 ?0940 10/10/21 ?0304  ?NA 143 142  ?K 4.0 3.4*  ?CL 108 110  ?CO2 22 25  ?GLUCOSE 210* 98  ?BUN 24* 18  ?CREATININE 0.88 0.66  ?CALCIUM 10.2 8.7*  ? ?Liver Function Tests ?No results for input(s): AST, ALT, ALKPHOS, BILITOT, PROT, ALBUMIN in the last 72 hours. ?No results for input(s): LIPASE, AMYLASE in the last 72 hours. ?High Sensitivity Troponin:   ?Recent Labs  ?Lab 10/09/21 ?0940 10/09/21 ?1133  ?TROPONINIHS 226* 180*  ?  ?BNP ?Invalid input(s): POCBNP ?D-Dimer ?No results for input(s): DDIMER in the last 72 hours. ?Hemoglobin A1C ?Recent Labs  ?  10/10/21 ?0304   ?HGBA1C 6.1*  ? ?Fasting Lipid Panel ?Recent Labs  ?  10/10/21 ?0304  ?CHOL 96  ?HDL 41  ?[place] 43  ?TRIG 62  ?CHOLHDL 2.3  ? ?Thyroid Function Tests ?Recent Labs  ?  10/10/21 ?0304  ?TSH 1.062  ? ?_____________  ?CT CORONARY MORPH W/CTA COR W/SCORE W/CA W/CM &/OR WO/CM ? ?Result Date: 10/10/2021 ?CLINICAL DATA:  Chest pain, jaw pain, rapid atrial flutter, mildly elevated troponin. EXAM: Cardiac/Coronary  CTA TECHNIQUE: The patient was scanned on a [employer]. FINDINGS: A 120 kV prospective scan was triggered in the descending thoracic aorta at 111 HU's. Axial non-contrast 3 mm slices were carried out through the heart. The data set was analyzed on a dedicated work station and scored using the Rains. Gantry rotation speed was 250 msecs and collimation was .6 mm. 0.8 mg of  sl NTG was given. The 3D data set was reconstructed in 5% intervals of the 67-82 % of the R-R cycle. Diastolic phases were analyzed on a dedicated work station using MPR, MIP and VRT modes. The patient received 80 cc of contrast. Image quality: good Aorta:  Normal size.  Mild aortic atheroscleroses.  No dissection. Aortic Valve:  Trileaflet.  No calcifications. Coronary Arteries:  Normal coronary origin.  Right dominance. RCA is a large dominant artery that gives rise to PDA and PLA. There is mild calcified focal plaque (0-24%). Left main is a large artery that gives rise to LAD and LCX arteries. LAD is a large vessel that has scattered proximal and mid calcified plaque, 25-49% (mild) non obstructive. LCX is a non-dominant artery that gives rise to one large OM1 branch. There is no plaque. Other findings: Normal pulmonary vein drainage into the left atrium. Normal left atrial appendage without a thrombus. Mildly dilated pulmonary artery (35 mm). Mild mitral annular calcification. Calculated EF 57%, normal Please see radiology report for non cardiac findings. IMPRESSION: 1. Coronary calcium score of 60. This was 7 percentile  for age and sex matched control. 2. Normal coronary origin with right dominance. 3.  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 4. Mildly dilated pulmonary artery (likely secondary to increas

## 2021-10-10 NOTE — Progress Notes (Signed)
?  Echocardiogram ?2D Echocardiogram has been performed. ? ?Maria Mcguire ?10/10/2021, 1:48 PM ?

## 2021-10-11 ENCOUNTER — Encounter: Admit: 2021-10-11 | Payer: PRIVATE HEALTH INSURANCE

## 2021-10-12 ENCOUNTER — Encounter: Admit: 2021-10-12 | Payer: PRIVATE HEALTH INSURANCE

## 2021-10-12 NOTE — Progress Notes
Care Coordination:Patient stated that she moved permanently to [place] and asked to cancel all future appointment.

## 2021-10-13 ENCOUNTER — Encounter: Payer: Self-pay | Admitting: Cardiology

## 2021-10-13 ENCOUNTER — Telehealth: Payer: Self-pay | Admitting: Cardiology

## 2021-10-13 NOTE — Telephone Encounter (Signed)
Attempted phone call to pt.  Left voicemail message to return call to [phone] or she may send a MyChart message. ?

## 2021-10-13 NOTE — Telephone Encounter (Signed)
Ne ? ? ? ?Patient says she is feeling great no major problems > She says sh have been experiencing acid reflux for the last 2 days. She wants to know if Dr Marlou Porch thinks that this is due to diet or medication?  ? ? ?Pt c/o medication issue: ? ?1. Name of Medication:  Eliquis, Atorvastatin, Flecainide, Lisinopril and Metoprolol ? ?2. How are you currently taking this medication (dosage and times per day)?  ? ?3. Are you having a reaction (difficulty breathing--STAT)?  ? ?4. What is your medication issue? Having acid reflux- not sure if is coming from some or any of her medicine ? ?

## 2021-10-13 NOTE — Telephone Encounter (Signed)
Spoke with pt who complains of acid reflux which started on Monday 10/11/2021 morning after eating breakfast and taking medications.She was discharged from the hospital on 10/10/2021. Pt reports she was started on Eliquis, Atorvastatin, Flecainide, Lisinopril and Metoprolol.  She states she has been watching her diet and does not have a history of reflux.  She has not taken anything for her symptoms.  Pt resides in [place] and will be flying back tomorrow. ?Pt's diet since Monday has consisted of scrambled eggs, [place] bacon or a chicken sausage, yogurt, strawberries, pork loin and brussels sprouts.  Provided pt education on reflux and diet control. ?Pt would like to know if any of her new medications could be related to the reflux. Pt advised Dr Marlou Porch and his nurse are not in the office today but will forward to pharmacy team for review.  Pt verbalizes understanding and agrees with current plan. ?

## 2021-10-14 NOTE — Telephone Encounter (Addendum)
Dyspepsia reported in 6-7% of patients on atorvastatin, 1-3% of patients on flecainide, and 1% of patients on metoprolol. Could try replacing atorvastatin with rosuvastatin '20mg'$  daily to see if symptoms improve. ?

## 2021-10-14 NOTE — Telephone Encounter (Signed)
To Dr Marlou Porch for review and orders. ?

## 2021-10-18 NOTE — Telephone Encounter (Signed)
Lets replace atorvastatin with Crestor 20 mg once a day.  Thank you.  Hopefully this will help with the dyspepsia.  ?Maria Furbish, MD  ? ?Spoke with pt who reports she has stopped drinking coffee and taking fish oil and she acid reflux is greatly improved.  At this point she does not want to make any other changes in her medications.  She reports she did see her PCP today and has been referred to a cardiologist there in [place].  If she has questions or concerns before being able to establish there, she will call back.  She was grateful for the call, information and "all the care she received while here visiting her daughter.    ?

## 2021-10-27 ENCOUNTER — Encounter: Admit: 2021-10-27 | Payer: Medicare (Managed Care) | Attending: Internal Medicine

## 2021-12-09 ENCOUNTER — Encounter: Admit: 2021-12-09 | Payer: PRIVATE HEALTH INSURANCE | Attending: Internal Medicine

## 2022-12-13 IMAGING — CT CT HEART MORP W/ CTA COR W/ SCORE W/ CA W/CM &/OR W/O CM
1 of 8 series · 11 of 20 positions shown, 14 images · IV contrast (APPLIED)
Comparison: Chest radiograph of 1 day prior

Addendum:
CLINICAL DATA: Chest pain, jaw pain, rapid atrial flutter, mildly
elevated troponin.

EXAM:
Cardiac/Coronary  CTA
TECHNIQUE: The patient was scanned on a [employer] scanner.

[Series 12: multiphase ts · axial · 0.41mm/px · z∈[+200,+317]mm · 11 of 3177 slices shown, 14 images]
[im 265/3177  vessel]
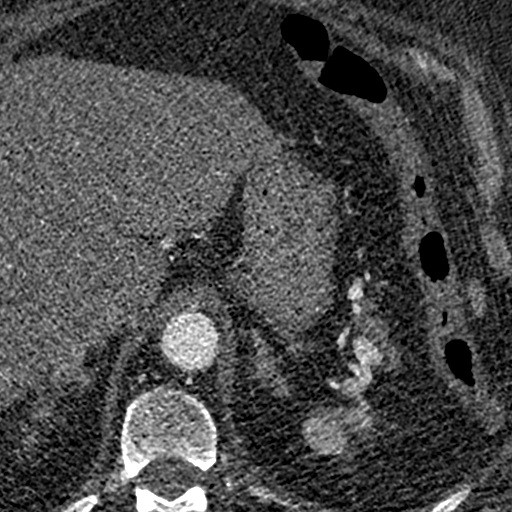
[im 265/3177  lung]
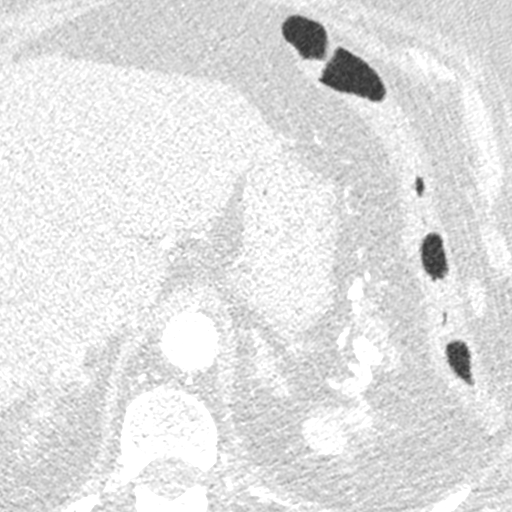
[im 530/3177  vessel]
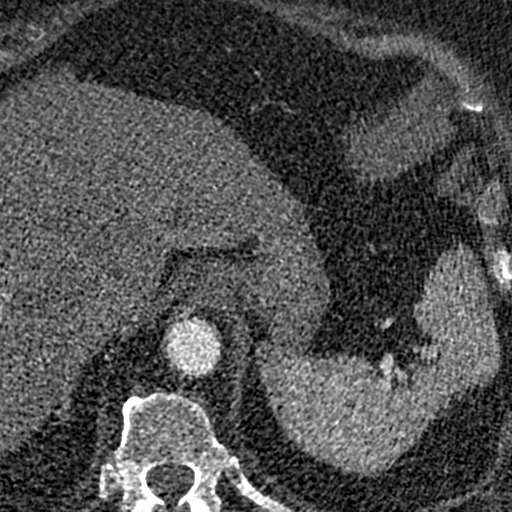
[im 795/3177  vessel]
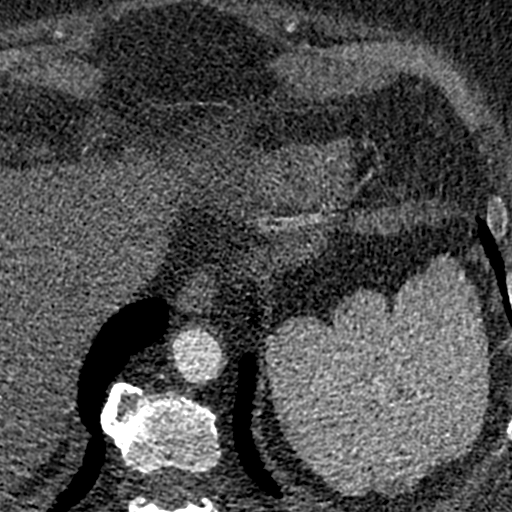
[im 1059/3177  vessel]
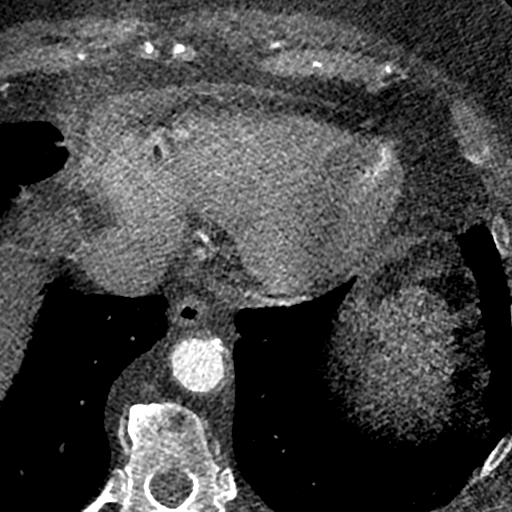
[im 1324/3177  vessel]
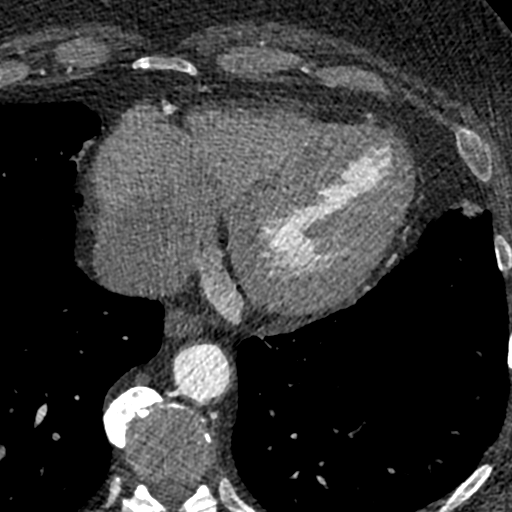
[im 1324/3177  lung]
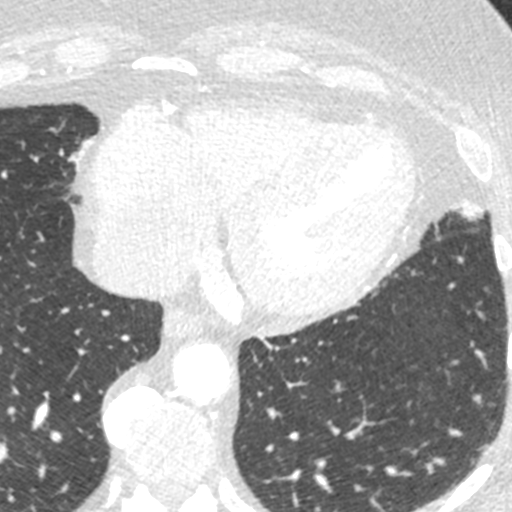
[im 1589/3177  vessel]
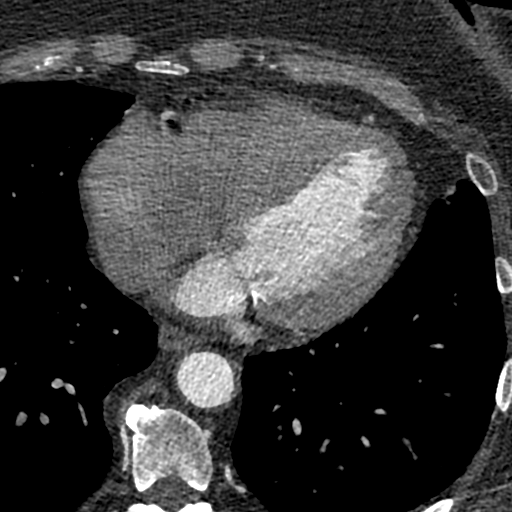
[im 1853/3177  vessel]
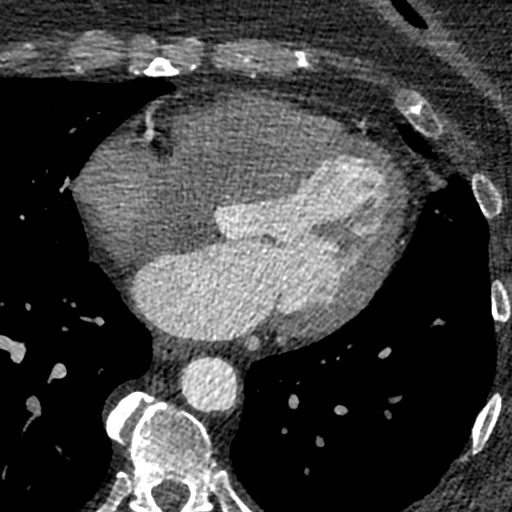
[im 2118/3177  vessel]
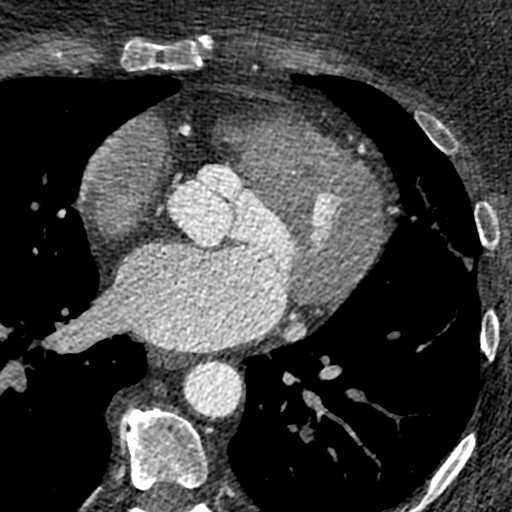
[im 2383/3177  vessel]
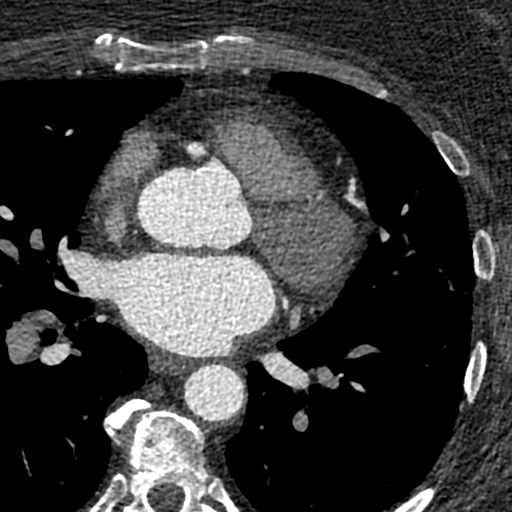
[im 2383/3177  lung]
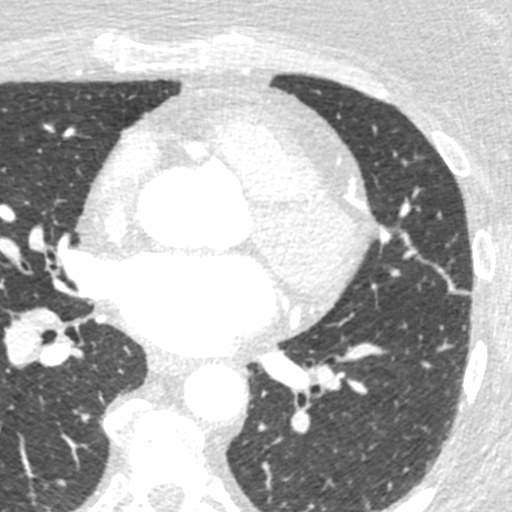
[im 2647/3177  vessel]
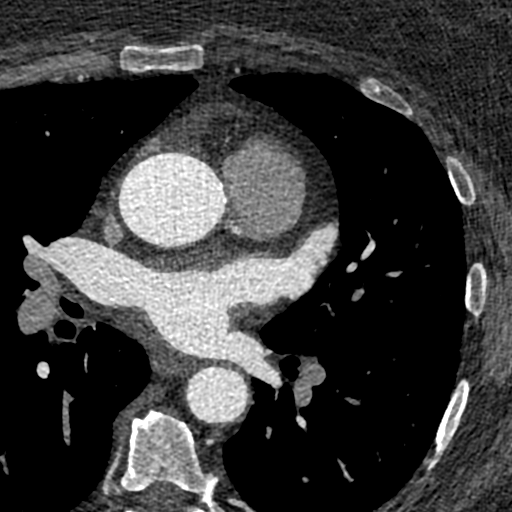
[im 2912/3177  vessel]
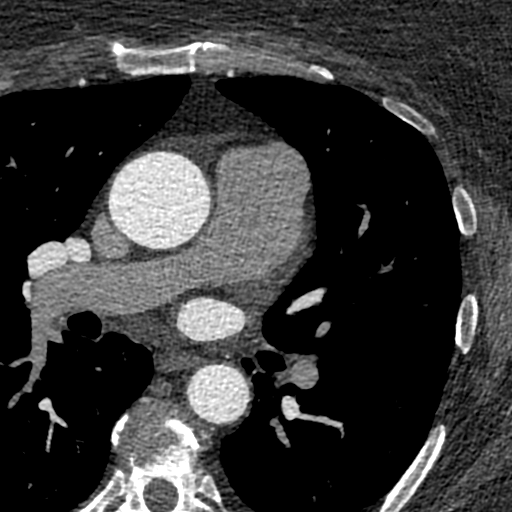

[11 of 20 positions shown; findings below may reference images not displayed]

FINDINGS: A 120 kV prospective scan was triggered in the descending thoracic
aorta at 111 HU's. Axial non-contrast 3 mm slices were carried out
through the heart. The data set was analyzed on a dedicated work
station and scored using the Agatson method. Gantry rotation speed
was 250 msecs and collimation was .6 mm. 0.8 mg of sl NTG was given.
The 3D data set was reconstructed in 5% intervals of the 67-82 % of
the R-R cycle. Diastolic phases were analyzed on a dedicated work
station using MPR, MIP and VRT modes. The patient received 80 cc of
contrast.

Image quality: good

Aorta:  Normal size.  Mild aortic atheroscleroses.  No dissection.

Aortic Valve:  Trileaflet.  No calcifications.

Coronary Arteries:  Normal coronary origin.  Right dominance.

RCA is a large dominant artery that gives rise to PDA and PLA. There
is mild calcified focal plaque (0-24%).

Left main is a large artery that gives rise to LAD and LCX arteries.

LAD is a large vessel that has scattered proximal and mid calcified
plaque, 25-49% (mild) non obstructive.

LCX is a non-dominant artery that gives rise to one large OM1
branch. There is no plaque.

Other findings:

Normal pulmonary vein drainage into the left atrium.

Normal left atrial appendage without a thrombus.

Mildly dilated pulmonary artery (35 mm).

Mild mitral annular calcification.

Calculated EF 57%, normal

Please see radiology report for non cardiac findings.
IMPRESSION: 1. Coronary calcium score of 60. This was 67 percentile for age and
sex matched control.

2. Normal coronary origin with right dominance.

3.  Mild non obstructive CAD, calcified plaque in LAD and RCA.

4. Mildly dilated pulmonary artery (likely secondary to increase
pulmonary pressure as a result of obesity)

5.  Aortic atherosclerosis.

CAD-RADS 2. Mild non-obstructive CAD (25-49%). Consider
non-atherosclerotic causes of chest pain. Consider preventive
therapy and risk factor modification.

EXAM:
OVER-READ INTERPRETATION  CT CHEST

The following report is an over-read performed by radiologist Dr.
Daikel Sinatra [REDACTED] on 10/11/2021. This over-read
does not include interpretation of cardiac or coronary anatomy or
pathology. The coronary CTA interpretation by the cardiologist is
attached.
FINDINGS: Vascular: Upper normal ascending aortic caliber, 3.8 cm on [DATE].
Aortic atherosclerosis. Pulmonary artery enlargement, outflow tract
3.7 cm. No central pulmonary embolism, on this non-dedicated study.

Mediastinum/Nodes: No imaged thoracic adenopathy.

Lungs/Pleura: No pleural fluid. 2 mm nodule in the right minor
fissure is likely a subpleural lymph node. Lingular scarring.

Upper Abdomen: Normal imaged portions of the liver, spleen, stomach,
adrenal glands, kidneys.

Musculoskeletal: No acute osseous abnormality.
IMPRESSION: 1. No acute findings in the imaged extracardiac chest.
2. Pulmonary artery enlargement suggests pulmonary arterial
hypertension.
3. Aortic Atherosclerosis ([id number]).

*** End of Addendum ***
FINDINGS: A 120 kV prospective scan was triggered in the descending thoracic
aorta at 111 HU's. Axial non-contrast 3 mm slices were carried out
through the heart. The data set was analyzed on a dedicated work
station and scored using the Agatson method. Gantry rotation speed
was 250 msecs and collimation was .6 mm. 0.8 mg of sl NTG was given.
The 3D data set was reconstructed in 5% intervals of the 67-82 % of
the R-R cycle. Diastolic phases were analyzed on a dedicated work
station using MPR, MIP and VRT modes. The patient received 80 cc of
contrast.

Image quality: good

Aorta:  Normal size.  Mild aortic atheroscleroses.  No dissection.

Aortic Valve:  Trileaflet.  No calcifications.

Coronary Arteries:  Normal coronary origin.  Right dominance.

RCA is a large dominant artery that gives rise to PDA and PLA. There
is mild calcified focal plaque (0-24%).

Left main is a large artery that gives rise to LAD and LCX arteries.

LAD is a large vessel that has scattered proximal and mid calcified
plaque, 25-49% (mild) non obstructive.

LCX is a non-dominant artery that gives rise to one large OM1
branch. There is no plaque.

Other findings:

Normal pulmonary vein drainage into the left atrium.

Normal left atrial appendage without a thrombus.

Mildly dilated pulmonary artery (35 mm).

Mild mitral annular calcification.

Calculated EF 57%, normal

Please see radiology report for non cardiac findings.
IMPRESSION: 1. Coronary calcium score of 60. This was 67 percentile for age and
sex matched control.

2. Normal coronary origin with right dominance.

3.  Mild non obstructive CAD, calcified plaque in LAD and RCA.

4. Mildly dilated pulmonary artery (likely secondary to increase
pulmonary pressure as a result of obesity)

5.  Aortic atherosclerosis.

CAD-RADS 2. Mild non-obstructive CAD (25-49%). Consider
non-atherosclerotic causes of chest pain. Consider preventive
therapy and risk factor modification.

## 2022-12-26 ENCOUNTER — Other Ambulatory Visit: Payer: Self-pay

## 2022-12-26 MED ORDER — FLECAINIDE ACETATE 50 MG PO TABS: 50.0000 mg | ORAL_TABLET | Freq: Two times a day (BID) | ORAL | 0 refills | Status: DC

## 2022-12-28 ENCOUNTER — Other Ambulatory Visit: Payer: Self-pay

## 2022-12-28 MED ORDER — FLECAINIDE ACETATE 50 MG PO TABS: 50.0000 mg | ORAL_TABLET | Freq: Two times a day (BID) | ORAL | Status: AC

## 2022-12-28 NOTE — Addendum Note (Signed)
Addended by: Anselm Lis A on: 12/28/2022 12:33 PM   Modules accepted: Orders
